# Patient Record
Sex: Female | Born: 1990 | Race: White | Hispanic: No | Smoking: Never smoker
Health system: Southern US, Community
[De-identification: ages and names within clinical notes are randomized; demographics above are authoritative.]

## PROBLEM LIST (undated history)

## (undated) HISTORY — PX: INDUCED ABORTION: SHX677

---

## 2015-03-29 NOTE — L&D Delivery Note (Signed)
Delivery Note At 10:17 PM a viable female was delivered via Vaginal, Spontaneous Delivery (Presentation: ;DOA  ).  APGAR: 9, 9; weight  .pending   Placenta status: ,intact, spontaneous .  Cord: 3 VC. Nuchal cord noted on perineum, tight and cord clamped and cut. .  Cord pH: not indicated  Anesthesia:  epidurl Episiotomy: None Lacerations: 2nd degree;Periurethral Suture Repair: 3.0 vicryl rapide Est. Blood Loss (mL):  350 cc  Mom to postpartum.  Baby to Couplet care / Skin to Skin.  Londen Lorge A. 10/27/2015, 10:38 PM

## 2015-04-17 LAB — OB RESULTS CONSOLE HEPATITIS B SURFACE ANTIGEN: Hepatitis B Surface Ag: NEGATIVE

## 2015-04-17 LAB — OB RESULTS CONSOLE HIV ANTIBODY (ROUTINE TESTING): HIV: NONREACTIVE

## 2015-04-17 LAB — OB RESULTS CONSOLE GC/CHLAMYDIA
Chlamydia: NEGATIVE
Gonorrhea: NEGATIVE

## 2015-04-17 LAB — OB RESULTS CONSOLE ABO/RH: RH Type: POSITIVE

## 2015-04-17 LAB — OB RESULTS CONSOLE RUBELLA ANTIBODY, IGM: Rubella: IMMUNE

## 2015-04-17 LAB — OB RESULTS CONSOLE ANTIBODY SCREEN: Antibody Screen: NEGATIVE

## 2015-04-17 LAB — OB RESULTS CONSOLE RPR: RPR: NONREACTIVE

## 2015-08-09 ENCOUNTER — Inpatient Hospital Stay (HOSPITAL_COMMUNITY): Admission: AD | Admit: 2015-08-09 | Payer: Self-pay | Source: Ambulatory Visit | Admitting: Obstetrics and Gynecology

## 2015-10-24 ENCOUNTER — Encounter (HOSPITAL_COMMUNITY): Payer: Self-pay | Admitting: *Deleted

## 2015-10-24 ENCOUNTER — Inpatient Hospital Stay (HOSPITAL_COMMUNITY)
Admission: AD | Admit: 2015-10-24 | Discharge: 2015-10-24 | Disposition: A | Discharge status: Home / Self Care | Source: Ambulatory Visit | Attending: Obstetrics and Gynecology | Admitting: Obstetrics and Gynecology

## 2015-10-24 DIAGNOSIS — O133 Gestational [pregnancy-induced] hypertension without significant proteinuria, third trimester: Secondary | ICD-10-CM | POA: Insufficient documentation

## 2015-10-24 DIAGNOSIS — N898 Other specified noninflammatory disorders of vagina: Secondary | ICD-10-CM

## 2015-10-24 DIAGNOSIS — O26893 Other specified pregnancy related conditions, third trimester: Secondary | ICD-10-CM | POA: Insufficient documentation

## 2015-10-24 DIAGNOSIS — Z3A37 37 weeks gestation of pregnancy: Secondary | ICD-10-CM

## 2015-10-24 DIAGNOSIS — O479 False labor, unspecified: Secondary | ICD-10-CM

## 2015-10-24 DIAGNOSIS — O471 False labor at or after 37 completed weeks of gestation: Secondary | ICD-10-CM | POA: Diagnosis not present

## 2015-10-24 LAB — COMPREHENSIVE METABOLIC PANEL
ALT: 11 U/L — ABNORMAL LOW (ref 14–54)
AST: 17 U/L (ref 15–41)
Albumin: 2.9 g/dL — ABNORMAL LOW (ref 3.5–5.0)
Alkaline Phosphatase: 106 U/L (ref 38–126)
Anion gap: 9 (ref 5–15)
BUN: 9 mg/dL (ref 6–20)
CO2: 24 mmol/L (ref 22–32)
Calcium: 8.7 mg/dL — ABNORMAL LOW (ref 8.9–10.3)
Chloride: 101 mmol/L (ref 101–111)
Creatinine, Ser: 0.77 mg/dL (ref 0.44–1.00)
GFR calc Af Amer: >60 mL/min (ref >60)
GFR calc non Af Amer: >60 mL/min (ref >60)
Glucose, Bld: 78 mg/dL (ref 65–99)
Potassium: 3.7 mmol/L (ref 3.5–5.1)
Sodium: 134 mmol/L — ABNORMAL LOW (ref 135–145)
Total Bilirubin: 0.9 mg/dL (ref 0.3–1.2)
Total Protein: 6.6 g/dL (ref 6.5–8.1)

## 2015-10-24 LAB — CBC
HCT: 39.4 % (ref 36.0–46.0)
Hemoglobin: 13.8 g/dL (ref 12.0–15.0)
MCH: 31.1 pg (ref 26.0–34.0)
MCHC: 35 g/dL (ref 30.0–36.0)
MCV: 88.7 fL (ref 78.0–100.0)
Platelets: 199 10*3/uL (ref 150–400)
RBC: 4.44 MIL/uL (ref 3.87–5.11)
RDW: 12.5 % (ref 11.5–15.5)
WBC: 10.2 10*3/uL (ref 4.0–10.5)

## 2015-10-24 LAB — URINALYSIS, ROUTINE W REFLEX MICROSCOPIC
Bilirubin Urine: NEGATIVE
Glucose, UA: NEGATIVE mg/dL
Hgb urine dipstick: NEGATIVE
Ketones, ur: NEGATIVE mg/dL
Leukocytes, UA: NEGATIVE
Nitrite: NEGATIVE
Protein, ur: NEGATIVE mg/dL
Specific Gravity, Urine: 1.015 (ref 1.005–1.030)
pH: 7 (ref 5.0–8.0)

## 2015-10-24 LAB — PROTEIN / CREATININE RATIO, URINE
Creatinine, Urine: 122 mg/dL
Protein Creatinine Ratio: 0.16 mg/mg{Cre} — ABNORMAL HIGH (ref 0.00–0.15)
Total Protein, Urine: 19 mg/dL

## 2015-10-24 LAB — POCT FERN TEST: POCT Fern Test: POSITIVE

## 2015-10-24 LAB — AMNISURE RUPTURE OF MEMBRANE (ROM) NOT AT ARMC: Amnisure ROM: NEGATIVE

## 2015-10-24 NOTE — MAU Note (Signed)
Pt states she is having contractions that last 25 to 30 seconds.  Pt states her lower abdomen just feels like it is cramping.  Pt states she is leaking every time she contracts.

## 2015-10-24 NOTE — Discharge Instructions (Signed)
Braxton Hicks Contractions °Contractions of the uterus can occur throughout pregnancy. Contractions are not always a sign that you are in labor.  °WHAT ARE BRAXTON HICKS CONTRACTIONS?  °Contractions that occur before labor are called Braxton Hicks contractions, or false labor. Toward the end of pregnancy (32-34 weeks), these contractions can develop more often and may become more forceful. This is not true labor because these contractions do not result in opening (dilatation) and thinning of the cervix. They are sometimes difficult to tell apart from true labor because these contractions can be forceful and people have different pain tolerances. You should not feel embarrassed if you go to the hospital with false labor. Sometimes, the only way to tell if you are in true labor is for your health care provider to look for changes in the cervix. °If there are no prenatal problems or other health problems associated with the pregnancy, it is completely safe to be sent home with false labor and await the onset of true labor. °HOW CAN YOU TELL THE DIFFERENCE BETWEEN TRUE AND FALSE LABOR? °False Labor °· The contractions of false labor are usually shorter and not as hard as those of true labor.   °· The contractions are usually irregular.   °· The contractions are often felt in the front of the lower abdomen and in the groin.   °· The contractions may go away when you walk around or change positions while lying down.   °· The contractions get weaker and are shorter lasting as time goes on.   °· The contractions do not usually become progressively stronger, regular, and closer together as with true labor.   °True Labor °· Contractions in true labor last 30-70 seconds, become very regular, usually become more intense, and increase in frequency.   °· The contractions do not go away with walking.   °· The discomfort is usually felt in the top of the uterus and spreads to the lower abdomen and low back.   °· True labor can be  determined by your health care provider with an exam. This will show that the cervix is dilating and getting thinner.   °WHAT TO REMEMBER °· Keep up with your usual exercises and follow other instructions given by your health care provider.   °· Take medicines as directed by your health care provider.   °· Keep your regular prenatal appointments.   °· Eat and drink lightly if you think you are going into labor.   °· If Braxton Hicks contractions are making you uncomfortable:   °¨ Change your position from lying down or resting to walking, or from walking to resting.   °¨ Sit and rest in a tub of warm water.   °¨ Drink 2-3 glasses of water. Dehydration may cause these contractions.   °¨ Do slow and deep breathing several times an hour.   °WHEN SHOULD I SEEK IMMEDIATE MEDICAL CARE? °Seek immediate medical care if: °· Your contractions become stronger, more regular, and closer together.   °· You have fluid leaking or gushing from your vagina.   °· You have a fever.   °· You pass blood-tinged mucus.   °· You have vaginal bleeding.   °· You have continuous abdominal pain.   °· You have low back pain that you never had before.   °· You feel your baby's head pushing down and causing pelvic pressure.   °· Your baby is not moving as much as it used to.   °  °This information is not intended to replace advice given to you by your health care provider. Make sure you discuss any questions you have with your health care   provider.   Document Released: 03/14/2005 Document Revised: 03/19/2013 Document Reviewed: 12/24/2012 Elsevier Interactive Patient Education 2016 [employer]. Hypertension During Pregnancy Hypertension, or high blood pressure, is when there is extra pressure inside your blood vessels that carry blood from the heart to the rest of your body (arteries). It can happen at any time in life, including pregnancy. Hypertension during pregnancy can cause problems for you and your baby. Your baby might not weigh as  much as he or she should at birth or might be born early (premature). Very bad cases of hypertension during pregnancy can be life-threatening.  Different types of hypertension can occur during pregnancy. These include:  Chronic hypertension. This happens when a woman has hypertension before pregnancy and it continues during pregnancy.  Gestational hypertension. This is when hypertension develops during pregnancy.  Preeclampsia or toxemia of pregnancy. This is a very serious type of hypertension that develops only during pregnancy. It affects the whole body and can be very dangerous for both mother and baby.  Gestational hypertension and preeclampsia usually go away after your baby is born. Your blood pressure will likely stabilize within 6 weeks. Women who have hypertension during pregnancy have a greater chance of developing hypertension later in life or with future pregnancies. RISK FACTORS There are certain factors that make it more likely for you to develop hypertension during pregnancy. These include:  Having hypertension before pregnancy.  Having hypertension during a previous pregnancy.  Being overweight.  Being older than 40 years.  Being pregnant with more than one baby.  Having diabetes or kidney problems. SIGNS AND SYMPTOMS Chronic and gestational hypertension rarely cause symptoms. Preeclampsia has symptoms, which may include:  Increased protein in your urine. Your health care provider will check for this at every prenatal visit.  Swelling of your hands and face.  Rapid weight gain.  Headaches.  Visual changes.  Being bothered by light.  Abdominal pain, especially in the upper right area.  Chest pain.  Shortness of breath.  Increased reflexes.  Seizures. These occur with a more severe form of preeclampsia, called eclampsia. DIAGNOSIS  You may be diagnosed with hypertension during a regular prenatal exam. At each prenatal visit, you may have:  Your blood  pressure checked.  A urine test to check for protein in your urine. The type of hypertension you are diagnosed with depends on when you developed it. It also depends on your specific blood pressure reading.  Developing hypertension before 20 weeks of pregnancy is consistent with chronic hypertension.  Developing hypertension after 20 weeks of pregnancy is consistent with gestational hypertension.  Hypertension with increased urinary protein is diagnosed as preeclampsia.  Blood pressure measurements that stay above 0000000 systolic or [id number] diastolic are a sign of severe preeclampsia. TREATMENT Treatment for hypertension during pregnancy varies. Treatment depends on the type of hypertension and how serious it is.  If you take medicine for chronic hypertension, you may need to switch medicines.  Medicines called ACE inhibitors should not be taken during pregnancy.  Low-dose aspirin may be suggested for women who have risk factors for preeclampsia.  If you have gestational hypertension, you may need to take a blood pressure medicine that is safe during pregnancy. Your health care provider will recommend the correct medicine.  If you have severe preeclampsia, you may need to be in the hospital. Health care providers will watch you and your baby very closely. You also may need to take medicine called magnesium sulfate to prevent seizures and lower blood  pressure.  Sometimes, an early delivery is needed. This may be the case if the condition worsens. It would be done to protect you and your baby. The only cure for preeclampsia is delivery.  Your health care provider may recommend that you take one low-dose aspirin (81 mg) each day to help prevent high blood pressure during your pregnancy if you are at risk for preeclampsia. You may be at risk for preeclampsia if:  You had preeclampsia or eclampsia during a previous pregnancy.  Your baby did not grow as expected during a previous pregnancy.  You  experienced preterm birth with a previous pregnancy.  You experienced a separation of the placenta from the uterus (placental abruption) during a previous pregnancy.  You experienced the loss of your baby during a previous pregnancy.  You are pregnant with more than one baby.  You have other medical conditions, such as diabetes or an autoimmune disease. HOME CARE INSTRUCTIONS  Schedule and keep all of your regular prenatal care appointments. This is important.  Take medicines only as directed by your health care provider. Tell your health care provider about all medicines you take.  Eat as little salt as possible.  Get regular exercise.  Do not drink alcohol.  Do not use tobacco products.  Do not drink products with caffeine.  Lie on your left side when resting. SEEK IMMEDIATE MEDICAL CARE IF:  You have severe abdominal pain.  You have sudden swelling in your hands, ankles, or face.  You gain 4 pounds (1.8 kg) or more in 1 week.  You vomit repeatedly.  You have vaginal bleeding.  You do not feel your baby moving as much.  You have a headache.  You have blurred or double vision.  You have muscle twitching or spasms.  You have shortness of breath.  You have blue fingernails or lips.  You have blood in your urine. MAKE SURE YOU:  Understand these instructions.  Will watch your condition.  Will get help right away if you are not doing well or get worse.   This information is not intended to replace advice given to you by your health care provider. Make sure you discuss any questions you have with your health care provider.   Document Released: 11/30/2010 Document Revised: 04/04/2014 Document Reviewed: 10/11/2012 Elsevier Interactive Patient Education [employer].

## 2015-10-24 NOTE — MAU Provider Note (Signed)
S: Claudia Rodriguez is a 25 y.o. year old G12P0020 female at [redacted]w[redacted]d weeks gestation who presents to MAU reporting contractions, leaking increased thin white fluid since this morning. Patient was noted to have elevated blood pressure during MAU visit today. No history of hypertension before during pregnancy. Denies headache, vision changes or epigastric pain.  O: Patient Vitals for the past 24 hrs:  BP Temp Temp src Pulse Resp  10/24/15 1243 125/87 - - 76 -  10/24/15 1100 126/90 - - 89 -  10/24/15 1045 150/97 - - 84 -  10/24/15 1030 138/93 - - 92 -  10/24/15 1015 136/100 - - 91 -  10/24/15 1000 131/93 97.5 F (36.4 C) Oral 95 20  10/24/15 0955 137/97 - - 88 -  10/24/15 0953 137/97 - - 84 -   General: Mild discomfort with contractions Abdomen soft, nontender, gravid, size equals dates. Ext: 1+ pedal edema Neuro: DTRs 2+, no clonus Pelvic: Normal external female genitalia. Moderate amount of thick, white, odorless discharge mixed with small amount of thin, white discharge. Neg pooling.   One area of questionable ferning (cervical mucus?) Amnisure ordered per Dr. Ronita Hipps neg  Results for orders placed or performed during the hospital encounter of 10/24/15 (from the past 24 hour(s))  Urinalysis, Routine w reflex microscopic (not at [facility])     Status: None   Collection Time: 10/24/15  9:43 AM  Result Value Ref Range   Color, Urine YELLOW YELLOW   APPearance CLEAR CLEAR   Specific Gravity, Urine 1.015 1.005 - 1.030   pH 7.0 5.0 - 8.0   Glucose, UA NEGATIVE NEGATIVE mg/dL   Hgb urine dipstick NEGATIVE NEGATIVE   Bilirubin Urine NEGATIVE NEGATIVE   Ketones, ur NEGATIVE NEGATIVE mg/dL   Protein, ur NEGATIVE NEGATIVE mg/dL   Nitrite NEGATIVE NEGATIVE   Leukocytes, UA NEGATIVE NEGATIVE  Protein / creatinine ratio, urine     Status: Abnormal   Collection Time: 10/24/15  9:43 AM  Result Value Ref Range   Creatinine, Urine 122.00 mg/dL   Total Protein, Urine 19 mg/dL   Protein Creatinine  Ratio 0.16 (H) 0.00 - 0.15 mg/mg[Cre]  CBC     Status: None   Collection Time: 10/24/15 10:29 AM  Result Value Ref Range   WBC 10.2 4.0 - 10.5 K/uL   RBC 4.44 3.87 - 5.11 MIL/uL   Hemoglobin 13.8 12.0 - 15.0 g/dL   HCT 39.4 36.0 - 46.0 %   MCV 88.7 78.0 - 100.0 fL   MCH 31.1 26.0 - 34.0 pg   MCHC 35.0 30.0 - 36.0 g/dL   RDW 12.5 11.5 - 15.5 %   Platelets 199 150 - 400 K/uL  Comprehensive metabolic panel     Status: Abnormal   Collection Time: 10/24/15 10:29 AM  Result Value Ref Range   Sodium 134 (L) 135 - 145 mmol/L   Potassium 3.7 3.5 - 5.1 mmol/L   Chloride 101 101 - 111 mmol/L   CO2 24 22 - 32 mmol/L   Glucose, Bld 78 65 - 99 mg/dL   BUN 9 6 - 20 mg/dL   Creatinine, Ser 0.77 0.44 - 1.00 mg/dL   Calcium 8.7 (L) 8.9 - 10.3 mg/dL   Total Protein 6.6 6.5 - 8.1 g/dL   Albumin 2.9 (L) 3.5 - 5.0 g/dL   AST 17 15 - 41 U/L   ALT 11 (L) 14 - 54 U/L   Alkaline Phosphatase 106 38 - 126 U/L   Total Bilirubin 0.9 0.3 - 1.2  mg/dL   GFR calc non Af Amer >60 >60 mL/min   GFR calc Af Amer >60 >60 mL/min   Anion gap 9 5 - 15  POCT fern test     Status: Abnormal   Collection Time: 10/24/15 11:49 AM  Result Value Ref Range   POCT Fern Test Positive = ruptured amniotic membanes   Amnisure rupture of membrane (rom)not at [facility]     Status: None   Collection Time: 10/24/15 11:52 AM  Result Value Ref Range   Amnisure ROM NEGATIVE     A:  Gestational HTN w/ out evidence of Pre-E. Vaginal discharge pregnancy--Probable VVC. Doubt ROM 2/2 exam, neg amnisure.   P: Discharge home in stable condition per consult with Dr. Ronita Hipps. Follow-up at [facility] as scheduled 10/28/2015 Return to maternity admissions as needed for labor, leaking of fluid or preeclampsia symptoms. Fetal kick counts, preeclampsia precautions, labor precautions. Declines Tx for VVC.   [place], [place] 10/24/2015 3:53 PM

## 2015-10-25 ENCOUNTER — Inpatient Hospital Stay (EMERGENCY_DEPARTMENT_HOSPITAL)
Admission: AD | Admit: 2015-10-25 | Discharge: 2015-10-25 | Disposition: A | Discharge status: Home / Self Care | Source: Ambulatory Visit | Attending: Obstetrics and Gynecology | Admitting: Obstetrics and Gynecology

## 2015-10-25 ENCOUNTER — Encounter (HOSPITAL_COMMUNITY): Payer: Self-pay | Admitting: *Deleted

## 2015-10-25 DIAGNOSIS — Z3A37 37 weeks gestation of pregnancy: Secondary | ICD-10-CM

## 2015-10-25 DIAGNOSIS — Z880 Allergy status to penicillin: Secondary | ICD-10-CM

## 2015-10-25 DIAGNOSIS — O471 False labor at or after 37 completed weeks of gestation: Secondary | ICD-10-CM | POA: Diagnosis not present

## 2015-10-25 DIAGNOSIS — Z79899 Other long term (current) drug therapy: Secondary | ICD-10-CM | POA: Insufficient documentation

## 2015-10-25 LAB — COMPREHENSIVE METABOLIC PANEL
ALT: 12 U/L — ABNORMAL LOW (ref 14–54)
AST: 18 U/L (ref 15–41)
Albumin: 2.8 g/dL — ABNORMAL LOW (ref 3.5–5.0)
Alkaline Phosphatase: 101 U/L (ref 38–126)
Anion gap: 7 (ref 5–15)
BUN: 10 mg/dL (ref 6–20)
CO2: 22 mmol/L (ref 22–32)
Calcium: 8.9 mg/dL (ref 8.9–10.3)
Chloride: 105 mmol/L (ref 101–111)
Creatinine, Ser: 0.6 mg/dL (ref 0.44–1.00)
GFR calc Af Amer: >60 mL/min (ref >60)
GFR calc non Af Amer: >60 mL/min (ref >60)
Glucose, Bld: 78 mg/dL (ref 65–99)
Potassium: 3.8 mmol/L (ref 3.5–5.1)
Sodium: 134 mmol/L — ABNORMAL LOW (ref 135–145)
Total Bilirubin: 1.1 mg/dL (ref 0.3–1.2)
Total Protein: 6.4 g/dL — ABNORMAL LOW (ref 6.5–8.1)

## 2015-10-25 LAB — URINALYSIS, ROUTINE W REFLEX MICROSCOPIC
Bilirubin Urine: NEGATIVE
Glucose, UA: NEGATIVE mg/dL
Ketones, ur: NEGATIVE mg/dL
Nitrite: NEGATIVE
Protein, ur: NEGATIVE mg/dL
Specific Gravity, Urine: 1.015 (ref 1.005–1.030)
pH: 7 (ref 5.0–8.0)

## 2015-10-25 LAB — PROTEIN / CREATININE RATIO, URINE
Creatinine, Urine: 99 mg/dL
Protein Creatinine Ratio: 0.18 mg/mg{Cre} — ABNORMAL HIGH (ref 0.00–0.15)
Total Protein, Urine: 18 mg/dL

## 2015-10-25 LAB — CBC
HCT: 38.1 % (ref 36.0–46.0)
Hemoglobin: 13.5 g/dL (ref 12.0–15.0)
MCH: 31.2 pg (ref 26.0–34.0)
MCHC: 35.4 g/dL (ref 30.0–36.0)
MCV: 88 fL (ref 78.0–100.0)
Platelets: 197 10*3/uL (ref 150–400)
RBC: 4.33 MIL/uL (ref 3.87–5.11)
RDW: 12.4 % (ref 11.5–15.5)
WBC: 9.9 10*3/uL (ref 4.0–10.5)

## 2015-10-25 LAB — URINE MICROSCOPIC-ADD ON

## 2015-10-25 LAB — POCT FERN TEST: POCT Fern Test: NEGATIVE

## 2015-10-25 MED ORDER — ZOLPIDEM TARTRATE 5 MG PO TABS: 5.0000 mg | ORAL_TABLET | Freq: Every evening | ORAL | 1 refills | Status: DC | PRN

## 2015-10-25 MED ORDER — NALBUPHINE HCL 10 MG/ML IJ SOLN
5.0000 mg | Freq: Once | INTRAMUSCULAR | Status: AC
  Administered 2015-10-25: 5 mg via INTRAVENOUS
  Filled 2015-10-25: qty 1

## 2015-10-25 NOTE — MAU Provider Note (Signed)
I was asked to recheck the patient's cervix since I had checked it before Patient was not discharged after the last note. She is very reluctant to go "because I can't do this at home".  Cervix has only changed slightly in 3 hours  Vitals:   10/25/15 1359 10/25/15 1414 10/25/15 1429 10/25/15 1445  BP: 128/92 125/90 120/82 125/93  Pulse: 86 82 85 84  Resp:      Temp:      TempSrc:       Results for orders placed or performed during the hospital encounter of 10/25/15 (from the past 72 hour(s))  Urinalysis, Routine w reflex microscopic (not at [facility])     Status: Abnormal   Collection Time: 10/25/15 11:53 AM  Result Value Ref Range   Color, Urine YELLOW YELLOW   APPearance CLEAR CLEAR   Specific Gravity, Urine 1.015 1.005 - 1.030   pH 7.0 5.0 - 8.0   Glucose, UA NEGATIVE NEGATIVE mg/dL   Hgb urine dipstick SMALL (A) NEGATIVE   Bilirubin Urine NEGATIVE NEGATIVE   Ketones, ur NEGATIVE NEGATIVE mg/dL   Protein, ur NEGATIVE NEGATIVE mg/dL   Nitrite NEGATIVE NEGATIVE   Leukocytes, UA SMALL (A) NEGATIVE  Protein / creatinine ratio, urine     Status: Abnormal   Collection Time: 10/25/15 11:53 AM  Result Value Ref Range   Creatinine, Urine 99.00 mg/dL   Total Protein, Urine 18 mg/dL    Comment: NO NORMAL RANGE ESTABLISHED FOR THIS TEST   Protein Creatinine Ratio 0.18 (H) 0.00 - 0.15 mg/mg[Cre]  Urine microscopic-add on     Status: Abnormal   Collection Time: 10/25/15 11:53 AM  Result Value Ref Range   Squamous Epithelial / LPF TOO NUMEROUS TO COUNT (A) NONE SEEN   WBC, UA 6-30 0 - 5 WBC/hpf   RBC / HPF 0-5 0 - 5 RBC/hpf   Bacteria, UA MANY (A) NONE SEEN   Urine-Other MUCOUS PRESENT   Fern Test     Status: Normal   Collection Time: 10/25/15 12:20 PM  Result Value Ref Range   POCT Fern Test Negative = intact amniotic membranes   CBC     Status: None   Collection Time: 10/25/15  1:00 PM  Result Value Ref Range   WBC 9.9 4.0 - 10.5 K/uL   RBC 4.33 3.87 - 5.11 MIL/uL   Hemoglobin  13.5 12.0 - 15.0 g/dL   HCT 38.1 36.0 - 46.0 %   MCV 88.0 78.0 - 100.0 fL   MCH 31.2 26.0 - 34.0 pg   MCHC 35.4 30.0 - 36.0 g/dL   RDW 12.4 11.5 - 15.5 %   Platelets 197 150 - 400 K/uL  Comprehensive metabolic panel     Status: Abnormal   Collection Time: 10/25/15  1:00 PM  Result Value Ref Range   Sodium 134 (L) 135 - 145 mmol/L   Potassium 3.8 3.5 - 5.1 mmol/L   Chloride 105 101 - 111 mmol/L   CO2 22 22 - 32 mmol/L   Glucose, Bld 78 65 - 99 mg/dL   BUN 10 6 - 20 mg/dL   Creatinine, Ser 0.60 0.44 - 1.00 mg/dL   Calcium 8.9 8.9 - 10.3 mg/dL   Total Protein 6.4 (L) 6.5 - 8.1 g/dL   Albumin 2.8 (L) 3.5 - 5.0 g/dL   AST 18 15 - 41 U/L   ALT 12 (L) 14 - 54 U/L   Alkaline Phosphatase 101 38 - 126 U/L   Total Bilirubin 1.1 0.3 -  1.2 mg/dL   GFR calc non Af Amer >60 >60 mL/min   GFR calc Af Amer >60 >60 mL/min    Comment: (NOTE) The eGFR has been calculated using the CKD EPI equation. This calculation has not been validated in all clinical situations. eGFR's persistently <60 mL/min signify possible Chronic Kidney Disease.    Anion gap 7 5 - 15   Pt denies headache or visual changes.  A:  SIUP at [redacted]w[redacted]d      Gestational hypertension       Latent phase vs prodromal labor  P:  [facility]

## 2015-10-25 NOTE — Progress Notes (Signed)
Dr. Benjie Karvonen notified of pt in MAU.  Notified of a G3P0 at [redacted]w[redacted]d.  Notified that pt was checked earlier and was 2.5 cm and at her last check was 3, 80, -2, and vertex.  Notified that pt had elevated blood pressures and lab yesterday.  Notified that pt had elevated blood pressures again today.  Notified of protein creatine ratio results.  Notified of pt blood pressure readings.  Provider states to discharge home with labor precautions and educate pt about hypertension in pregnancy.  Provider states to have the pt call the office in the morning to make a follow-up appointment.

## 2015-10-25 NOTE — MAU Note (Signed)
Pt states she had a gush of fluid around 0900 this morning and feels like her belly is smaller.  After that the contractions got closer together and stronger.  Pt states she did not stop contracting after she left here yesterday.

## 2015-10-25 NOTE — MAU Provider Note (Signed)
Chief Complaint:  Contractions   First Provider Initiated Contact with Patient 10/25/15 1208     HPI  HPI: Claudia Rodriguez is a 25 y.o. G3P0020 at 71w6dwho presents to maternity admissions reporting contractions and possible leaking of fluids.  Was called by RN to rule out labor. Mild diastolic BP elevation.  She reports good fetal movement, denies vaginal bleeding, vaginal itching/burning, urinary symptoms, h/a, dizziness, n/v, diarrhea, constipation or fever/chills.  She denies headache, visual changes or RUQ abdominal pain. RN Note: Pt states she had a gush of fluid around 0900 this morning and feels like her belly is smaller.  After that the contractions got closer together and stronger.  Pt states she did not stop contracting after she left here yesterday.  Past Medical History: History reviewed. No pertinent past medical history.  Past obstetric history: OB History  Gravida Para Term Preterm AB Living  3       2    SAB TAB Ectopic Multiple Live Births               # Outcome Date GA Lbr Len/2nd Weight Sex Delivery Anes PTL Lv  3 Current           2 AB           1 AB               Past Surgical History: Past Surgical History:  Procedure Laterality Date  . INDUCED ABORTION     twice    Family History: History reviewed. No pertinent family history.  Social History: Social History  Substance Use Topics  . Smoking status: Never Smoker  . Smokeless tobacco: Never Used  . Alcohol use No    Allergies:  Allergies  Allergen Reactions  . Amoxicillin Hives and Other (See Comments)    Has patient had a PCN reaction causing immediate rash, facial/tongue/throat swelling, SOB or lightheadedness with hypotension: No Has patient had a PCN reaction causing severe rash involving mucus membranes or skin necrosis: No Has patient had a PCN reaction that required hospitalization No Has patient had a PCN reaction occurring within the last 10 years: No If all of the above answers are  "NO", then may proceed with Cephalosporin use.  Marland Kitchen Penicillins Hives and Other (See Comments)    Has patient had a PCN reaction causing immediate rash, facial/tongue/throat swelling, SOB or lightheadedness with hypotension: No Has patient had a PCN reaction causing severe rash involving mucus membranes or skin necrosis: No Has patient had a PCN reaction that required hospitalization No Has patient had a PCN reaction occurring within the last 10 years: No If all of the above answers are "NO", then may proceed with Cephalosporin use.    Meds:  Prescriptions Prior to Admission  Medication Sig Dispense Refill Last Dose  . Prenatal Vit-Fe Fumarate-FA (PRENATAL MULTIVITAMIN) TABS tablet Take 1 tablet by mouth at bedtime.   10/24/2015 at Unknown time  . sertraline (ZOLOFT) 50 MG tablet Take 50 mg by mouth at bedtime.   10/24/2015 at Unknown time    I have reviewed patient's Past Medical Hx, Surgical Hx, Family Hx, Social Hx, medications and allergies.   ROS:  Review of Systems  Constitutional: Positive for fatigue. Negative for chills and fever.  Gastrointestinal: Positive for abdominal pain. Negative for diarrhea, nausea and vomiting.  Genitourinary: Positive for pelvic pain and vaginal discharge. Negative for vaginal bleeding.   Other systems negative  Physical Exam  Patient Vitals for the past 24 hrs:  BP Temp Temp src Pulse Resp  10/25/15 1214 139/97 - - 82 -  10/25/15 1159 (!) 133/105 - - 91 -  10/25/15 1154 132/95 98.4 F (36.9 C) Oral 98 20   Constitutional: Well-developed, well-nourished female in no acute distress, but breathing through contractions.  Cardiovascular: normal rate and rhythm Respiratory: normal effort, clear to auscultation bilaterally GI: Abd soft, non-tender, gravid appropriate for gestational age.   No rebound or guarding. MS: Extremities nontender, no edema, normal ROM Neurologic: Alert and oriented x 4.  GU: Neg CVAT.  PELVIC EXAM: Cervix pink, visually  closed, without lesion, scant white creamy discharge, vaginal walls and external genitalia normal    NO POOLING, NO FERNING  Dilation: 2.5 Effacement (%): 80 Station: -2 Presentation: Vertex Exam by:: Hansel Feinstein, CNM  FHT:  Baseline 140 , moderate variability, accelerations present, no decelerations Contractions: q 4-5 mins   Labs: No results found for this or any previous visit (from the past 24 hour(s)).    Imaging:  No results found.  MAU Course/MDM: No ferning seen  Assessment: SIUP at [redacted]w[redacted]d  Uterine contractions- mild- no cervical change No evidence of rupture of membranes  Plan: CBC , CMP and Urine P/C ratio ordered  - normal Serial BP monitoring Reck VE in 2 hours and assess for labor States her pain is "9" so will give IM Nubain for prodromal labor No s/s PEC.    Medication List    ASK your doctor about these medications   prenatal multivitamin Tabs tablet Take 1 tablet by mouth at bedtime.   sertraline 50 MG tablet Commonly known as:  ZOLOFT Take 50 mg by mouth at bedtime.      Pt stable at time of discharge.  Hansel Feinstein CNM, MSN Certified Nurse-Midwife 10/25/2015 12:18 PM

## 2015-10-25 NOTE — Discharge Instructions (Signed)
Third Trimester of Pregnancy °The third trimester is from week 29 through week 42, months 7 through 9. The third trimester is a time when the fetus is growing rapidly. At the end of the ninth month, the fetus is about 20 inches in length and weighs 6-10 pounds.  °BODY CHANGES °Your body goes through many changes during pregnancy. The changes vary from woman to woman.  °· Your weight will continue to increase. You can expect to gain 25-35 pounds (11-16 kg) by the end of the pregnancy. °· You may begin to get stretch marks on your hips, abdomen, and breasts. °· You may urinate more often because the fetus is moving lower into your pelvis and pressing on your bladder. °· You may develop or continue to have heartburn as a result of your pregnancy. °· You may develop constipation because certain hormones are causing the muscles that push waste through your intestines to slow down. °· You may develop hemorrhoids or swollen, bulging veins (varicose veins). °· You may have pelvic pain because of the weight gain and pregnancy hormones relaxing your joints between the bones in your pelvis. Backaches may result from overexertion of the muscles supporting your posture. °· You may have changes in your hair. These can include thickening of your hair, rapid growth, and changes in texture. Some women also have hair loss during or after pregnancy, or hair that feels dry or thin. Your hair will most likely return to normal after your baby is born. °· Your breasts will continue to grow and be tender. A yellow discharge may leak from your breasts called colostrum. °· Your belly button may stick out. °· You may feel short of breath because of your expanding uterus. °· You may notice the fetus "dropping," or moving lower in your abdomen. °· You may have a bloody mucus discharge. This usually occurs a few days to a week before labor begins. °· Your cervix becomes thin and soft (effaced) near your due date. °WHAT TO EXPECT AT YOUR PRENATAL  EXAMS  °You will have prenatal exams every 2 weeks until week 36. Then, you will have weekly prenatal exams. During a routine prenatal visit: °· You will be weighed to make sure you and the fetus are growing normally. °· Your blood pressure is taken. °· Your abdomen will be measured to track your baby's growth. °· The fetal heartbeat will be listened to. °· Any test results from the previous visit will be discussed. °· You may have a cervical check near your due date to see if you have effaced. °At around 36 weeks, your caregiver will check your cervix. At the same time, your caregiver will also perform a test on the secretions of the vaginal tissue. This test is to determine if a type of bacteria, Group B streptococcus, is present. Your caregiver will explain this further. °Your caregiver may ask you: °· What your birth plan is. °· How you are feeling. °· If you are feeling the baby move. °· If you have had any abnormal symptoms, such as leaking fluid, bleeding, severe headaches, or abdominal cramping. °· If you are using any tobacco products, including cigarettes, chewing tobacco, and electronic cigarettes. °· If you have any questions. °Other tests or screenings that may be performed during your third trimester include: °· Blood tests that check for low iron levels (anemia). °· Fetal testing to check the health, activity level, and growth of the fetus. Testing is done if you have certain medical conditions or if   there are problems during the pregnancy. °· HIV (human immunodeficiency virus) testing. If you are at high risk, you may be screened for HIV during your third trimester of pregnancy. °FALSE LABOR °You may feel small, irregular contractions that eventually go away. These are called Braxton Hicks contractions, or false labor. Contractions may last for hours, days, or even weeks before true labor sets in. If contractions come at regular intervals, intensify, or become painful, it is best to be seen by your  caregiver.  °SIGNS OF LABOR  °· Menstrual-like cramps. °· Contractions that are 5 minutes apart or less. °· Contractions that start on the top of the uterus and spread down to the lower abdomen and back. °· A sense of increased pelvic pressure or back pain. °· A watery or bloody mucus discharge that comes from the vagina. °If you have any of these signs before the 37th week of pregnancy, call your caregiver right away. You need to go to the hospital to get checked immediately. °HOME CARE INSTRUCTIONS  °· Avoid all smoking, herbs, alcohol, and unprescribed drugs. These chemicals affect the formation and growth of the baby. °· Do not use any tobacco products, including cigarettes, chewing tobacco, and electronic cigarettes. If you need help quitting, ask your health care provider. You may receive counseling support and other resources to help you quit. °· Follow your caregiver's instructions regarding medicine use. There are medicines that are either safe or unsafe to take during pregnancy. °· Exercise only as directed by your caregiver. Experiencing uterine cramps is a good sign to stop exercising. °· Continue to eat regular, healthy meals. °· Wear a good support bra for breast tenderness. °· Do not use hot tubs, steam rooms, or saunas. °· Wear your seat belt at all times when driving. °· Avoid raw meat, uncooked cheese, cat litter boxes, and soil used by cats. These carry germs that can cause birth defects in the baby. °· Take your prenatal vitamins. °· Take 1500-2000 mg of calcium daily starting at the 20th week of pregnancy until you deliver your baby. °· Try taking a stool softener (if your caregiver approves) if you develop constipation. Eat more high-fiber foods, such as fresh vegetables or fruit and whole grains. Drink plenty of fluids to keep your urine clear or pale yellow. °· Take warm sitz baths to soothe any pain or discomfort caused by hemorrhoids. Use hemorrhoid cream if your caregiver approves. °· If  you develop varicose veins, wear support hose. Elevate your feet for 15 minutes, 3-4 times a day. Limit salt in your diet. °· Avoid heavy lifting, wear low heal shoes, and practice good posture. °· Rest a lot with your legs elevated if you have leg cramps or low back pain. °· Visit your dentist if you have not gone during your pregnancy. Use a soft toothbrush to brush your teeth and be gentle when you floss. °· A sexual relationship may be continued unless your caregiver directs you otherwise. °· Do not travel far distances unless it is absolutely necessary and only with the approval of your caregiver. °· Take prenatal classes to understand, practice, and ask questions about the labor and delivery. °· Make a trial run to the hospital. °· Pack your hospital bag. °· Prepare the baby's nursery. °· Continue to go to all your prenatal visits as directed by your caregiver. °SEEK MEDICAL CARE IF: °· You are unsure if you are in labor or if your water has broken. °· You have dizziness. °· You have   mild pelvic cramps, pelvic pressure, or nagging pain in your abdominal area. °· You have persistent nausea, vomiting, or diarrhea. °· You have a bad smelling vaginal discharge. °· You have pain with urination. °SEEK IMMEDIATE MEDICAL CARE IF:  °· You have a fever. °· You are leaking fluid from your vagina. °· You have spotting or bleeding from your vagina. °· You have severe abdominal cramping or pain. °· You have rapid weight loss or gain. °· You have shortness of breath with chest pain. °· You notice sudden or extreme swelling of your face, hands, ankles, feet, or legs. °· You have not felt your baby move in over an hour. °· You have severe headaches that do not go away with medicine. °· You have vision changes. °  °This information is not intended to replace advice given to you by your health care provider. Make sure you discuss any questions you have with your health care provider. °  °Document Released: 03/08/2001 Document  Revised: 04/04/2014 Document Reviewed: 05/15/2012 °Elsevier Interactive Patient Education ©2016 [employer] °Fetal Movement Counts °Patient Name: __________________________________________________ Patient Due Date: ____________________ °Performing a fetal movement count is highly recommended in high-risk pregnancies, but it is good for every pregnant woman to do. Your health care provider may ask you to start counting fetal movements at 28 weeks of the pregnancy. Fetal movements often increase: °· After eating a full meal. °· After physical activity. °· After eating or drinking something sweet or cold. °· At rest. °Pay attention to when you feel the baby is most active. This will help you notice a pattern of your baby's sleep and wake cycles and what factors contribute to an increase in fetal movement. It is important to perform a fetal movement count at the same time each day when your baby is normally most active.  °HOW TO COUNT FETAL MOVEMENTS °1. Find a quiet and comfortable area to sit or lie down on your left side. Lying on your left side provides the best blood and oxygen circulation to your baby. °2. Write down the day and time on a sheet of paper or in a journal. °3. Start counting kicks, flutters, swishes, rolls, or jabs in a 2-hour period. You should feel at least 10 movements within 2 hours. °4. If you do not feel 10 movements in 2 hours, wait 2-3 hours and count again. Look for a change in the pattern or not enough counts in 2 hours. °SEEK MEDICAL CARE IF: °· You feel less than 10 counts in 2 hours, tried twice. °· There is no movement in over an hour. °· The pattern is changing or taking longer each day to reach 10 counts in 2 hours. °· You feel the baby is not moving as he or she usually does. °Date: ____________ Movements: ____________ Start time: ____________ Finish time: ____________  °Date: ____________ Movements: ____________ Start time: ____________ Finish time: ____________ °Date:  ____________ Movements: ____________ Start time: ____________ Finish time: ____________ °Date: ____________ Movements: ____________ Start time: ____________ Finish time: ____________ °Date: ____________ Movements: ____________ Start time: ____________ Finish time: ____________ °Date: ____________ Movements: ____________ Start time: ____________ Finish time: ____________ °Date: ____________ Movements: ____________ Start time: ____________ Finish time: ____________ °Date: ____________ Movements: ____________ Start time: ____________ Finish time: ____________  °Date: ____________ Movements: ____________ Start time: ____________ Finish time: ____________ °Date: ____________ Movements: ____________ Start time: ____________ Finish time: ____________ °Date: ____________ Movements: ____________ Start time: ____________ Finish time: ____________ °Date: ____________ Movements: ____________ Start time: ____________ Finish time: ____________ °Date:   ____________ Movements: ____________ Start time: ____________ Elizebeth Koller time: ____________ Date: ____________ Movements: ____________ Start time: ____________ Elizebeth Koller time: ____________ Date: ____________ Movements: ____________ Start time: ____________ Elizebeth Koller time: ____________  Date: ____________ Movements: ____________ Start time: ____________ Elizebeth Koller time: ____________ Date: ____________ Movements: ____________ Start time: ____________ Elizebeth Koller time: ____________ Date: ____________ Movements: ____________ Start time: ____________ Elizebeth Koller time: ____________ Date: ____________ Movements: ____________ Start time: ____________ Elizebeth Koller time: ____________ Date: ____________ Movements: ____________ Start time: ____________ Elizebeth Koller time: ____________ Date: ____________ Movements: ____________ Start time: ____________ Elizebeth Koller time: ____________ Date: ____________ Movements: ____________ Start time: ____________ Elizebeth Koller time: ____________  Date: ____________ Movements: ____________ Start  time: ____________ Elizebeth Koller time: ____________ Date: ____________ Movements: ____________ Start time: ____________ Elizebeth Koller time: ____________ Date: ____________ Movements: ____________ Start time: ____________ Elizebeth Koller time: ____________ Date: ____________ Movements: ____________ Start time: ____________ Elizebeth Koller time: ____________ Date: ____________ Movements: ____________ Start time: ____________ Elizebeth Koller time: ____________ Date: ____________ Movements: ____________ Start time: ____________ Elizebeth Koller time: ____________ Date: ____________ Movements: ____________ Start time: ____________ Elizebeth Koller time: ____________  Date: ____________ Movements: ____________ Start time: ____________ Elizebeth Koller time: ____________ Date: ____________ Movements: ____________ Start time: ____________ Elizebeth Koller time: ____________ Date: ____________ Movements: ____________ Start time: ____________ Elizebeth Koller time: ____________ Date: ____________ Movements: ____________ Start time: ____________ Elizebeth Koller time: ____________ Date: ____________ Movements: ____________ Start time: ____________ Elizebeth Koller time: ____________ Date: ____________ Movements: ____________ Start time: ____________ Elizebeth Koller time: ____________ Date: ____________ Movements: ____________ Start time: ____________ Elizebeth Koller time: ____________  Date: ____________ Movements: ____________ Start time: ____________ Elizebeth Koller time: ____________ Date: ____________ Movements: ____________ Start time: ____________ Elizebeth Koller time: ____________ Date: ____________ Movements: ____________ Start time: ____________ Elizebeth Koller time: ____________ Date: ____________ Movements: ____________ Start time: ____________ Elizebeth Koller time: ____________ Date: ____________ Movements: ____________ Start time: ____________ Elizebeth Koller time: ____________ Date: ____________ Movements: ____________ Start time: ____________ Elizebeth Koller time: ____________ Date: ____________ Movements: ____________ Start time: ____________ Elizebeth Koller time: ____________    Date: ____________ Movements: ____________ Start time: ____________ Elizebeth Koller time: ____________ Date: ____________ Movements: ____________ Start time: ____________ Elizebeth Koller time: ____________ Date: ____________ Movements: ____________ Start time: ____________ Elizebeth Koller time: ____________ Date: ____________ Movements: ____________ Start time: ____________ Elizebeth Koller time: ____________ Date: ____________ Movements: ____________ Start time: ____________ Elizebeth Koller time: ____________ Date: ____________ Movements: ____________ Start time: ____________ Elizebeth Koller time: ____________ Date: ____________ Movements: ____________ Start time: ____________ Elizebeth Koller time: ____________  Date: ____________ Movements: ____________ Start time: ____________ Elizebeth Koller time: ____________ Date: ____________ Movements: ____________ Start time: ____________ Elizebeth Koller time: ____________ Date: ____________ Movements: ____________ Start time: ____________ Elizebeth Koller time: ____________ Date: ____________ Movements: ____________ Start time: ____________ Elizebeth Koller time: ____________ Date: ____________ Movements: ____________ Start time: ____________ Elizebeth Koller time: ____________ Date: ____________ Movements: ____________ Start time: ____________ Elizebeth Koller time: ____________   This information is not intended to replace advice given to you by your health care provider. Make sure you discuss any questions you have with your health care provider.   Document Released: 04/13/2006 Document Revised: 04/04/2014 Document Reviewed: 01/09/2012 Elsevier Interactive Patient Education 2016 [employer]. Hypertension During Pregnancy Hypertension, or high blood pressure, is when there is extra pressure inside your blood vessels that carry blood from the heart to the rest of your body (arteries). It can happen at any time in life, including pregnancy. Hypertension during pregnancy can cause problems for you and your baby. Your baby might not weigh as much as he or she should at  birth or might be born early (premature). Very bad cases of hypertension during pregnancy can be life-threatening.  Different types of hypertension can occur during pregnancy. These include:  Chronic hypertension. This happens when a woman has  hypertension before pregnancy and it continues during pregnancy.  Gestational hypertension. This is when hypertension develops during pregnancy.  Preeclampsia or toxemia of pregnancy. This is a very serious type of hypertension that develops only during pregnancy. It affects the whole body and can be very dangerous for both mother and baby.  Gestational hypertension and preeclampsia usually go away after your baby is born. Your blood pressure will likely stabilize within 6 weeks. Women who have hypertension during pregnancy have a greater chance of developing hypertension later in life or with future pregnancies. RISK FACTORS There are certain factors that make it more likely for you to develop hypertension during pregnancy. These include:  Having hypertension before pregnancy.  Having hypertension during a previous pregnancy.  Being overweight.  Being older than 40 years.  Being pregnant with more than one baby.  Having diabetes or kidney problems. SIGNS AND SYMPTOMS Chronic and gestational hypertension rarely cause symptoms. Preeclampsia has symptoms, which may include:  Increased protein in your urine. Your health care provider will check for this at every prenatal visit.  Swelling of your hands and face.  Rapid weight gain.  Headaches.  Visual changes.  Being bothered by light.  Abdominal pain, especially in the upper right area.  Chest pain.  Shortness of breath.  Increased reflexes.  Seizures. These occur with a more severe form of preeclampsia, called eclampsia. DIAGNOSIS  You may be diagnosed with hypertension during a regular prenatal exam. At each prenatal visit, you may have:  Your blood pressure checked.  A urine  test to check for protein in your urine. The type of hypertension you are diagnosed with depends on when you developed it. It also depends on your specific blood pressure reading.  Developing hypertension before 20 weeks of pregnancy is consistent with chronic hypertension.  Developing hypertension after 20 weeks of pregnancy is consistent with gestational hypertension.  Hypertension with increased urinary protein is diagnosed as preeclampsia.  Blood pressure measurements that stay above 0000000 systolic or [id number] diastolic are a sign of severe preeclampsia. TREATMENT Treatment for hypertension during pregnancy varies. Treatment depends on the type of hypertension and how serious it is.  If you take medicine for chronic hypertension, you may need to switch medicines.  Medicines called ACE inhibitors should not be taken during pregnancy.  Low-dose aspirin may be suggested for women who have risk factors for preeclampsia.  If you have gestational hypertension, you may need to take a blood pressure medicine that is safe during pregnancy. Your health care provider will recommend the correct medicine.  If you have severe preeclampsia, you may need to be in the hospital. Health care providers will watch you and your baby very closely. You also may need to take medicine called magnesium sulfate to prevent seizures and lower blood pressure.  Sometimes, an early delivery is needed. This may be the case if the condition worsens. It would be done to protect you and your baby. The only cure for preeclampsia is delivery.  Your health care provider may recommend that you take one low-dose aspirin (81 mg) each day to help prevent high blood pressure during your pregnancy if you are at risk for preeclampsia. You may be at risk for preeclampsia if:  You had preeclampsia or eclampsia during a previous pregnancy.  Your baby did not grow as expected during a previous pregnancy.  You experienced preterm birth  with a previous pregnancy.  You experienced a separation of the placenta from the uterus (placental abruption) during a  previous pregnancy.  You experienced the loss of your baby during a previous pregnancy.  You are pregnant with more than one baby.  You have other medical conditions, such as diabetes or an autoimmune disease. HOME CARE INSTRUCTIONS  Schedule and keep all of your regular prenatal care appointments. This is important.  Take medicines only as directed by your health care provider. Tell your health care provider about all medicines you take.  Eat as little salt as possible.  Get regular exercise.  Do not drink alcohol.  Do not use tobacco products.  Do not drink products with caffeine.  Lie on your left side when resting. SEEK IMMEDIATE MEDICAL CARE IF:  You have severe abdominal pain.  You have sudden swelling in your hands, ankles, or face.  You gain 4 pounds (1.8 kg) or more in 1 week.  You vomit repeatedly.  You have vaginal bleeding.  You do not feel your baby moving as much.  You have a headache.  You have blurred or double vision.  You have muscle twitching or spasms.  You have shortness of breath.  You have blue fingernails or lips.  You have blood in your urine. MAKE SURE YOU:  Understand these instructions.  Will watch your condition.  Will get help right away if you are not doing well or get worse.   This information is not intended to replace advice given to you by your health care provider. Make sure you discuss any questions you have with your health care provider.   Document Released: 11/30/2010 Document Revised: 04/04/2014 Document Reviewed: 10/11/2012 Elsevier Interactive Patient Education [employer].

## 2015-10-27 ENCOUNTER — Encounter (HOSPITAL_COMMUNITY): Payer: Self-pay

## 2015-10-27 ENCOUNTER — Inpatient Hospital Stay (HOSPITAL_COMMUNITY): Admitting: Anesthesiology

## 2015-10-27 ENCOUNTER — Inpatient Hospital Stay (HOSPITAL_COMMUNITY)
Admission: AD | Admit: 2015-10-27 | Discharge: 2015-10-29 | DRG: 775 | Disposition: A | Discharge status: Home / Self Care | Source: Ambulatory Visit | Attending: Obstetrics | Admitting: Obstetrics

## 2015-10-27 DIAGNOSIS — F329 Major depressive disorder, single episode, unspecified: Secondary | ICD-10-CM | POA: Diagnosis present

## 2015-10-27 DIAGNOSIS — O134 Gestational [pregnancy-induced] hypertension without significant proteinuria, complicating childbirth: Secondary | ICD-10-CM | POA: Diagnosis present

## 2015-10-27 DIAGNOSIS — K219 Gastro-esophageal reflux disease without esophagitis: Secondary | ICD-10-CM | POA: Diagnosis present

## 2015-10-27 DIAGNOSIS — O9962 Diseases of the digestive system complicating childbirth: Secondary | ICD-10-CM | POA: Diagnosis present

## 2015-10-27 DIAGNOSIS — O403XX0 Polyhydramnios, third trimester, not applicable or unspecified: Secondary | ICD-10-CM | POA: Diagnosis present

## 2015-10-27 DIAGNOSIS — O99344 Other mental disorders complicating childbirth: Secondary | ICD-10-CM | POA: Diagnosis present

## 2015-10-27 DIAGNOSIS — Z3A38 38 weeks gestation of pregnancy: Secondary | ICD-10-CM

## 2015-10-27 DIAGNOSIS — Z3490 Encounter for supervision of normal pregnancy, unspecified, unspecified trimester: Secondary | ICD-10-CM

## 2015-10-27 LAB — COMPREHENSIVE METABOLIC PANEL
ALT: 12 U/L — ABNORMAL LOW (ref 14–54)
AST: 18 U/L (ref 15–41)
Albumin: 2.9 g/dL — ABNORMAL LOW (ref 3.5–5.0)
Alkaline Phosphatase: 113 U/L (ref 38–126)
Anion gap: 7 (ref 5–15)
BUN: 8 mg/dL (ref 6–20)
CO2: 22 mmol/L (ref 22–32)
Calcium: 9 mg/dL (ref 8.9–10.3)
Chloride: 104 mmol/L (ref 101–111)
Creatinine, Ser: 0.59 mg/dL (ref 0.44–1.00)
GFR calc Af Amer: >60 mL/min (ref >60)
GFR calc non Af Amer: >60 mL/min (ref >60)
Glucose, Bld: 104 mg/dL — ABNORMAL HIGH (ref 65–99)
Potassium: 3.7 mmol/L (ref 3.5–5.1)
Sodium: 133 mmol/L — ABNORMAL LOW (ref 135–145)
Total Bilirubin: 0.9 mg/dL (ref 0.3–1.2)
Total Protein: 6.2 g/dL — ABNORMAL LOW (ref 6.5–8.1)

## 2015-10-27 LAB — CBC
HCT: 39.7 % (ref 36.0–46.0)
Hemoglobin: 14 g/dL (ref 12.0–15.0)
MCH: 31.3 pg (ref 26.0–34.0)
MCHC: 35.3 g/dL (ref 30.0–36.0)
MCV: 88.6 fL (ref 78.0–100.0)
Platelets: 206 10*3/uL (ref 150–400)
RBC: 4.48 MIL/uL (ref 3.87–5.11)
RDW: 12.7 % (ref 11.5–15.5)
WBC: 9.1 10*3/uL (ref 4.0–10.5)

## 2015-10-27 LAB — TYPE AND SCREEN
ABO/RH(D): O POS
Antibody Screen: NEGATIVE

## 2015-10-27 LAB — LACTATE DEHYDROGENASE: LDH: 111 U/L (ref 98–192)

## 2015-10-27 LAB — OB RESULTS CONSOLE GBS: GBS: NEGATIVE

## 2015-10-27 LAB — ABO/RH: ABO/RH(D): O POS

## 2015-10-27 LAB — URIC ACID: Uric Acid, Serum: 5.5 mg/dL (ref 2.3–6.6)

## 2015-10-27 MED ORDER — LACTATED RINGERS IV SOLN: 500.0000 mL | INTRAVENOUS | Status: DC | PRN

## 2015-10-27 MED ORDER — PHENYLEPHRINE 40 MCG/ML (10ML) SYRINGE FOR IV PUSH (FOR BLOOD PRESSURE SUPPORT)
80.0000 ug | PREFILLED_SYRINGE | INTRAVENOUS | Status: DC | PRN
  Filled 2015-10-27: qty 5
  Filled 2015-10-27: qty 10

## 2015-10-27 MED ORDER — EPHEDRINE 5 MG/ML INJ
10.0000 mg | INTRAVENOUS | Status: DC | PRN
  Filled 2015-10-27: qty 4

## 2015-10-27 MED ORDER — FENTANYL 2.5 MCG/ML BUPIVACAINE 1/10 % EPIDURAL INFUSION (WH - ANES)
14.0000 mL/h | INTRAMUSCULAR | Status: DC | PRN
  Administered 2015-10-27 (×2): 14 mL/h via EPIDURAL
  Filled 2015-10-27: qty 125

## 2015-10-27 MED ORDER — ONDANSETRON HCL 4 MG/2ML IJ SOLN: 4.0000 mg | Freq: Four times a day (QID) | INTRAMUSCULAR | Status: DC | PRN

## 2015-10-27 MED ORDER — LIDOCAINE HCL (PF) 1 % IJ SOLN
30.0000 mL | INTRAMUSCULAR | Status: DC | PRN
  Filled 2015-10-27: qty 30

## 2015-10-27 MED ORDER — LIDOCAINE HCL (PF) 1 % IJ SOLN
INTRAMUSCULAR | Status: DC | PRN
  Administered 2015-10-27: 5 mL via EPIDURAL
  Administered 2015-10-27: 2 mL via EPIDURAL
  Administered 2015-10-27: 3 mL via EPIDURAL

## 2015-10-27 MED ORDER — IBUPROFEN 600 MG PO TABS
600.0000 mg | ORAL_TABLET | Freq: Four times a day (QID) | ORAL | Status: DC
  Administered 2015-10-28 – 2015-10-29 (×6): 600 mg via ORAL
  Filled 2015-10-27 (×6): qty 1

## 2015-10-27 MED ORDER — ACETAMINOPHEN 325 MG PO TABS
650.0000 mg | ORAL_TABLET | ORAL | Status: DC | PRN
  Filled 2015-10-27: qty 2

## 2015-10-27 MED ORDER — OXYCODONE-ACETAMINOPHEN 5-325 MG PO TABS: 1.0000 | ORAL_TABLET | ORAL | Status: DC | PRN

## 2015-10-27 MED ORDER — OXYCODONE-ACETAMINOPHEN 5-325 MG PO TABS: 2.0000 | ORAL_TABLET | ORAL | Status: DC | PRN

## 2015-10-27 MED ORDER — LACTATED RINGERS IV SOLN
500.0000 mL | Freq: Once | INTRAVENOUS | Status: AC
Start: 2015-10-27 — End: 2015-10-27
  Administered 2015-10-27: 500 mL via INTRAVENOUS

## 2015-10-27 MED ORDER — OXYTOCIN 40 UNITS IN LACTATED RINGERS INFUSION - SIMPLE MED: 2.5000 [IU]/h | INTRAVENOUS | Status: DC

## 2015-10-27 MED ORDER — LACTATED RINGERS IV SOLN
INTRAVENOUS | Status: DC
  Administered 2015-10-27 (×2): via INTRAVENOUS

## 2015-10-27 MED ORDER — DIPHENHYDRAMINE HCL 50 MG/ML IJ SOLN
12.5000 mg | INTRAMUSCULAR | Status: DC | PRN
  Administered 2015-10-27 (×2): 12.5 mg via INTRAVENOUS
  Filled 2015-10-27: qty 1

## 2015-10-27 MED ORDER — LACTATED RINGERS IV SOLN: 500.0000 mL | Freq: Once | INTRAVENOUS | Status: DC

## 2015-10-27 MED ORDER — OXYTOCIN BOLUS FROM INFUSION: 500.0000 mL | Freq: Once | INTRAVENOUS | Status: DC

## 2015-10-27 MED ORDER — PHENYLEPHRINE 40 MCG/ML (10ML) SYRINGE FOR IV PUSH (FOR BLOOD PRESSURE SUPPORT)
80.0000 ug | PREFILLED_SYRINGE | INTRAVENOUS | Status: DC | PRN
  Filled 2015-10-27: qty 10
  Filled 2015-10-27: qty 5

## 2015-10-27 MED ORDER — SOD CITRATE-CITRIC ACID 500-334 MG/5ML PO SOLN: 30.0000 mL | ORAL | Status: DC | PRN

## 2015-10-27 MED ORDER — OXYTOCIN 40 UNITS IN LACTATED RINGERS INFUSION - SIMPLE MED
1.0000 m[IU]/min | INTRAVENOUS | Status: DC
  Administered 2015-10-27: 2 m[IU]/min via INTRAVENOUS
  Filled 2015-10-27: qty 1000

## 2015-10-27 MED ORDER — TERBUTALINE SULFATE 1 MG/ML IJ SOLN
0.2500 mg | Freq: Once | INTRAMUSCULAR | Status: DC | PRN
  Filled 2015-10-27: qty 1

## 2015-10-27 NOTE — Anesthesia Procedure Notes (Signed)
Epidural Patient location during procedure: OB  Staffing Anesthesiologist: Catalina Gravel Performed: anesthesiologist   Preanesthetic Checklist Completed: patient identified, pre-op evaluation, timeout performed, IV checked, risks and benefits discussed and monitors and equipment checked  Epidural Patient position: sitting Prep: DuraPrep Patient monitoring: blood pressure and continuous pulse ox Approach: midline Location: L3-L4 Injection technique: LOR air  Needle:  Needle type: Tuohy  Needle gauge: 17 G Needle length: 9 cm Needle insertion depth: 4 cm Catheter size: 19 Gauge Catheter at skin depth: 8 cm Test dose: negative and Other (1% Lidocaine)  Additional Notes Patient identified.  Risk benefits discussed including failed block, incomplete pain control, headache, nerve damage, paralysis, blood pressure changes, nausea, vomiting, reactions to medication both toxic or allergic, and postpartum back pain.  Patient expressed understanding and wished to proceed.  All questions were answered.  Sterile technique used throughout procedure and epidural site dressed with sterile barrier dressing. No paresthesia or other complications noted. The patient did not experience any signs of intravascular injection such as tinnitus or metallic taste in mouth nor signs of intrathecal spread such as rapid motor block. Please see nursing notes for vital signs. Reason for block:procedure for pain

## 2015-10-27 NOTE — Anesthesia Preprocedure Evaluation (Signed)
Anesthesia Evaluation  Patient identified by MRN, date of birth, ID band Patient awake    Reviewed: Allergy & Precautions, NPO status , Patient's Chart, lab work & pertinent test results  Airway Mallampati: I  TM Distance: >3 FB Neck ROM: Full    Dental  (+) Teeth Intact, Dental Advisory Given   Pulmonary neg pulmonary ROS,    Pulmonary exam normal breath sounds clear to auscultation       Cardiovascular Exercise Tolerance: Good hypertension (GHTN), Normal cardiovascular exam Rhythm:Regular Rate:Normal     Neuro/Psych PSYCHIATRIC DISORDERS Anxiety Depression negative neurological ROS     GI/Hepatic negative GI ROS, Neg liver ROS,   Endo/Other  negative endocrine ROS  Renal/GU negative Renal ROS     Musculoskeletal negative musculoskeletal ROS (+)   Abdominal   Peds  Hematology negative hematology ROS (+) Plt 206k   Anesthesia Other Findings Day of surgery medications reviewed with the patient.  Reproductive/Obstetrics (+) Pregnancy                             Anesthesia Physical Anesthesia Plan  ASA: II  Anesthesia Plan: Epidural   Post-op Pain Management:    Induction:   Airway Management Planned:   Additional Equipment:   Intra-op Plan:   Post-operative Plan:   Informed Consent: I have reviewed the patients History and Physical, chart, labs and discussed the procedure including the risks, benefits and alternatives for the proposed anesthesia with the patient or authorized representative who has indicated his/her understanding and acceptance.   Dental advisory given  Plan Discussed with:   Anesthesia Plan Comments: (Patient identified. Risks/Benefits/Options discussed with patient including but not limited to bleeding, infection, nerve damage, paralysis, failed block, incomplete pain control, headache, blood pressure changes, nausea, vomiting, reactions to medication both  or allergic, itching and postpartum back pain. Confirmed with bedside nurse the patient's most recent platelet count. Confirmed with patient that they are not currently taking any anticoagulation, have any bleeding history or any family history of bleeding disorders. Patient expressed understanding and wished to proceed. All questions were answered. )        Anesthesia Quick Evaluation

## 2015-10-27 NOTE — Progress Notes (Signed)
S: Doing well, no complaints, pain well controlled as contractions not yet strong, planning epidural. No HA  O: BP 128/89   Pulse 74   Temp 98.3 F (36.8 C) (Oral)   Resp 16   Ht 5\' 7"  (1.702 m)   Wt 74.4 kg (164 lb)   BMI 25.69 kg/m    FHT:  FHR: 130s bpm, variability: moderate,  accelerations:  Present,  decelerations:  Absent UC:   Irregular, on pitocin SVE:   Dilation: 3 Effacement (%): 70 Station: Ballotable Exam by:: Pamala Hurry, MD  AROM clear   A / P:  25 y.o.  Obstetric History   G3   P0   T0   P0   A2   L0    SAB0   TAB0   Ectopic0   Multiple0   Live Births0    at [redacted]w[redacted]d IOL, gest htn, no sx, nl labs, bp stable  Fetal Wellbeing:  Category I Pain Control:  Labor support without medications  Anticipated MOD:  NSVD  Ammar Moffatt A. 10/27/2015, 5:08 PM

## 2015-10-27 NOTE — Anesthesia Pain Management Evaluation Note (Signed)
  CRNA Pain Management Visit Note  Patient: Claudia Rodriguez, 25 y.o., female  "Hello I am a member of the anesthesia team at [facility]. We have an anesthesia team available at all times to provide care throughout the hospital, including epidural management and anesthesia for C-section. I don't know your plan for the delivery whether it a natural birth, water birth, IV sedation, nitrous supplementation, doula or epidural, but we want to meet your pain goals."   1.Was your pain managed to your expectations on prior hospitalizations?   No prior hospitalizations  2.What is your expectation for pain management during this hospitalization?     Labor support without medications and epidural possibly  3.How can we help you reach that goal? Support and be available to provide epidural if pt. decides  Record the patient's initial score and the patient's pain goal.   Pain: 7  Pain Goal: 9 [facility] wants you to be able to say your pain was always managed very well.  Everette Rank 10/27/2015

## 2015-10-27 NOTE — H&P (Signed)
Claudia Rodriguez is a 25 y.o. G3P0020 at [redacted]w[redacted]d presenting for induction of labor due to gestational hypertension. Pt notes onset contractions over the weekend. Patient notes continued contractions over the last 4 days with limited rest and sleep. Patient has presented to MAU on Saturday and again on Sunday in latent labor with no significant cervical change. Blood pressures have been mildly elevated blood labs negative for preeclampsia. Patient presented to the office yesterday for evaluation with blood pressure of 138/90 and presented again today for evaluation with blood pressure 118/94. These elevated diastolics are in the setting of a patient with previously normal blood pressures. Patient notes mild headache but no scotomata. Patient reports fatigue from lack of sleep. No right upper quadrant pain. Good fetal movement, no leakage of fluid, no vaginal bleeding  [facility] first trimester - depression. On Zoloft GERD. On protonix - Gestational hypertension without severe features. Developed at 38 weeks. Will recommend move towards delivery - Elevated diabetic screen at 179, normal 3 hour - polyhydramnios. AFI 27 yesterday   Prenatal Transfer Tool  Maternal Diabetes: No Genetic Screening: Normal Maternal Ultrasounds/Referrals: Normal Fetal Ultrasounds or other Referrals:  None Maternal Substance Abuse:  No Significant Maternal Medications:  None Significant Maternal Lab Results: None     OB History    Gravida Para Term Preterm AB Living   3       2     SAB TAB Ectopic Multiple Live Births                 History reviewed. No pertinent past medical history. Past Surgical History:  Procedure Laterality Date  . INDUCED ABORTION     twice   Family History: family history is not on file. Social History:  reports that she has never smoked. She has never used smokeless tobacco. She reports that she does not drink alcohol or use drugs.  Review of Systems - Negative  except Continued contractions   Dilation: 3 Effacement (%): 50 Station: -3 Exam by:: William Hamburger, RN Blood pressure 118/75, pulse 77, temperature 99 F (37.2 C), temperature source Oral, resp. rate 20, height 5\' 7"  (1.702 m), weight 74.4 kg (164 lb).  Physical Exam:  Office BP 118/94, yesterday BP 138/90  Gen: well appearing, no distress CV:  RRR Pulm : CTAB Back: no CVAT Abd: gravid, NT, no RUQ pain LE: trace edema, equal bilaterally, non-tender Toco: none  FH: baseline 130s, accelerations present, no deceleratons, 10 beat variability  Prenatal labs: ABO, Rh: --/--/O POS (08/01 1205) Antibody: NEG (08/01 1205) Rubella: !Error!immune RPR: Nonreactive (01/20 0000)  HBsAg: Negative (01/20 0000)  HIV: Non-reactive (01/20 0000)  GBS: Negative (08/01 0000)  1 hr Glucola 179, nl 3 hr  Genetic screening normal quad  Anatomy US normal  CBC    Component Value Date/Time   WBC 9.1 10/27/2015 1205   RBC 4.48 10/27/2015 1205   HGB 14.0 10/27/2015 1205   HCT 39.7 10/27/2015 1205   PLT 206 10/27/2015 1205   MCV 88.6 10/27/2015 1205   MCH 31.3 10/27/2015 1205   MCHC 35.3 10/27/2015 1205   RDW 12.7 10/27/2015 1205    CMP     Component Value Date/Time   NA 133 (L) 10/27/2015 1205   K 3.7 10/27/2015 1205   CL 104 10/27/2015 1205   CO2 22 10/27/2015 1205   GLUCOSE 104 (H) 10/27/2015 1205   BUN 8 10/27/2015 1205   CREATININE 0.59 10/27/2015 1205   CALCIUM 9.0 10/27/2015  1205   PROT 6.2 (L) 10/27/2015 1205   ALBUMIN 2.9 (L) 10/27/2015 1205   AST 18 10/27/2015 1205   ALT 12 (L) 10/27/2015 1205   ALKPHOS 113 10/27/2015 1205   BILITOT 0.9 10/27/2015 1205   GFRNONAA >60 10/27/2015 1205   GFRAA >60 10/27/2015 1205      Assessment/Plan: 25 y.o. G3P0020 at [redacted]w[redacted]d Gestational hypertension, not severe, given >37 wks will move to delivery. Plan pitocin IOL, pt aware increase c/s risks with IOL. Repeat labs on admit GBS neg Reactive fetal testing Depression, continue  Zoloft Polyhydramnios   Reinhold Rickey A. 10/27/2015, 2:55 PM

## 2015-10-28 ENCOUNTER — Encounter (HOSPITAL_COMMUNITY): Payer: Self-pay | Admitting: Obstetrics and Gynecology

## 2015-10-28 LAB — CBC
HCT: 35.6 % — ABNORMAL LOW (ref 36.0–46.0)
Hemoglobin: 12.5 g/dL (ref 12.0–15.0)
MCH: 30.9 pg (ref 26.0–34.0)
MCHC: 35.1 g/dL (ref 30.0–36.0)
MCV: 88.1 fL (ref 78.0–100.0)
Platelets: 161 10*3/uL (ref 150–400)
RBC: 4.04 MIL/uL (ref 3.87–5.11)
RDW: 12.5 % (ref 11.5–15.5)
WBC: 12 10*3/uL — ABNORMAL HIGH (ref 4.0–10.5)

## 2015-10-28 LAB — RPR: RPR Ser Ql: NONREACTIVE

## 2015-10-28 MED ORDER — FLEET ENEMA 7-19 GM/118ML RE ENEM: 1.0000 | ENEMA | Freq: Every day | RECTAL | Status: DC | PRN

## 2015-10-28 MED ORDER — SIMETHICONE 80 MG PO CHEW: 80.0000 mg | CHEWABLE_TABLET | ORAL | Status: DC | PRN

## 2015-10-28 MED ORDER — DIPHENHYDRAMINE HCL 25 MG PO CAPS: 25.0000 mg | ORAL_CAPSULE | Freq: Four times a day (QID) | ORAL | Status: DC | PRN

## 2015-10-28 MED ORDER — ZOLPIDEM TARTRATE 5 MG PO TABS: 5.0000 mg | ORAL_TABLET | Freq: Every evening | ORAL | Status: DC | PRN

## 2015-10-28 MED ORDER — BENZOCAINE-MENTHOL 20-0.5 % EX AERO
1.0000 "application " | INHALATION_SPRAY | CUTANEOUS | Status: DC | PRN
  Administered 2015-10-28: 1 via TOPICAL
  Filled 2015-10-28: qty 56

## 2015-10-28 MED ORDER — HYDROXYZINE HCL 25 MG PO TABS
25.0000 mg | ORAL_TABLET | Freq: Three times a day (TID) | ORAL | Status: DC | PRN
  Filled 2015-10-28: qty 1

## 2015-10-28 MED ORDER — TETANUS-DIPHTH-ACELL PERTUSSIS 5-2.5-18.5 LF-MCG/0.5 IM SUSP: 0.5000 mL | Freq: Once | INTRAMUSCULAR | Status: DC

## 2015-10-28 MED ORDER — ONDANSETRON HCL 4 MG/2ML IJ SOLN: 4.0000 mg | INTRAMUSCULAR | Status: DC | PRN

## 2015-10-28 MED ORDER — WITCH HAZEL-GLYCERIN EX PADS: 1.0000 "application " | MEDICATED_PAD | CUTANEOUS | Status: DC | PRN

## 2015-10-28 MED ORDER — PRENATAL MULTIVITAMIN CH
1.0000 | ORAL_TABLET | Freq: Every day | ORAL | Status: DC
  Administered 2015-10-28: 1 via ORAL
  Filled 2015-10-28: qty 1

## 2015-10-28 MED ORDER — DIBUCAINE 1 % RE OINT
1.0000 "application " | TOPICAL_OINTMENT | RECTAL | Status: DC | PRN
  Administered 2015-10-28: 1 via RECTAL
  Filled 2015-10-28: qty 28

## 2015-10-28 MED ORDER — ACETAMINOPHEN 325 MG PO TABS
650.0000 mg | ORAL_TABLET | ORAL | Status: DC | PRN
  Administered 2015-10-28: 650 mg via ORAL
  Filled 2015-10-28: qty 2

## 2015-10-28 MED ORDER — ONDANSETRON HCL 4 MG PO TABS: 4.0000 mg | ORAL_TABLET | ORAL | Status: DC | PRN

## 2015-10-28 MED ORDER — SERTRALINE HCL 50 MG PO TABS
50.0000 mg | ORAL_TABLET | Freq: Every day | ORAL | Status: DC
  Administered 2015-10-28 (×2): 50 mg via ORAL
  Filled 2015-10-28 (×3): qty 1

## 2015-10-28 MED ORDER — COCONUT OIL OIL: 1.0000 "application " | TOPICAL_OIL | Status: DC | PRN

## 2015-10-28 MED ORDER — SENNOSIDES-DOCUSATE SODIUM 8.6-50 MG PO TABS
2.0000 | ORAL_TABLET | ORAL | Status: DC
Start: 2015-10-28 — End: 2015-10-29
  Administered 2015-10-28 (×2): 2 via ORAL
  Filled 2015-10-28 (×2): qty 2

## 2015-10-28 MED ORDER — BISACODYL 10 MG RE SUPP: 10.0000 mg | Freq: Every day | RECTAL | Status: DC | PRN

## 2015-10-28 NOTE — Progress Notes (Signed)
Patient ID: Claudia Rodriguez, female   DOB: [birth year], 25 y.o.   MRN: [id number] PPD # 1 SVD  S:  Reports feeling well.             Tolerating po/ No nausea or vomiting             Bleeding is light             Pain controlled with ibuprofen (OTC)             Up ad lib / ambulatory / voiding without difficulties    Newborn  Information for the patient's newborn:  Kelisha, Turnquist [id number]  female  breast feeding  / Circumcision NOT planning   O:  A & O x 3, in no apparent distress              VS:  Vitals:   10/27/15 2346 10/28/15 0045 10/28/15 0140 10/28/15 0544  BP: 122/76 125/72 121/62 104/76  Pulse: 78 80 70 72  Resp:  18 18 18   Temp:  98.7 F (37.1 C) 98.1 F (36.7 C) 98.3 F (36.8 C)  TempSrc:  Oral Oral   SpO2:      Weight:      Height:        LABS:  Recent Labs  10/27/15 1205 10/28/15 0548  WBC 9.1 12.0*  HGB 14.0 12.5  HCT 39.7 35.6*  PLT 206 161    Blood type: O POS (08/01 1205)  Rubella: Immune (01/20 0000)   I&O: I/O last 3 completed shifts: In: -  Out: 350 [Blood:350]          No intake/output data recorded.  Lungs: Clear and unlabored  Heart: regular rate and rhythm / no murmurs  Abdomen: soft, non-tender, non-distended             Fundus: firm, non-tender, U-1  Perineum: 2nd degree repair healing well, No edema  Lochia: minimal  Extremities: No edema, no calf pain or tenderness, No Homans    A/P: PPD # 1 25 y.o., [id number]   Principal Problem:    Postpartum care following vaginal delivery (8/1)   Doing well - stable status  Routine post partum orders  Anticipate discharge tomorrow    Laury Deep, M, MSN, CNM 10/28/2015, 9:03 AM

## 2015-10-28 NOTE — Anesthesia Postprocedure Evaluation (Signed)
Anesthesia Post Note  Patient: Claudia Rodriguez  Procedure(s) Performed: * No procedures listed *  Patient location during evaluation: Mother Baby Anesthesia Type: Epidural Level of consciousness: awake and alert and oriented Pain management: satisfactory to patient Vital Signs Assessment: post-procedure vital signs reviewed and stable Respiratory status: spontaneous breathing and nonlabored ventilation Cardiovascular status: stable Postop Assessment: no headache, no backache, no signs of nausea or vomiting, adequate PO intake and patient able to bend at knees (patient up walking) Anesthetic complications: no     Last Vitals:  Vitals:   10/28/15 0140 10/28/15 0544  BP: 121/62 104/76  Pulse: 70 72  Resp: 18 18  Temp: 36.7 C 36.8 C    Last Pain:  Vitals:   10/28/15 0608  TempSrc:   PainSc: 2    Pain Goal:                 Willa Rough

## 2015-10-28 NOTE — Lactation Note (Signed)
This note was copied from a baby's chart. Lactation Consultation Note  Patient Name: Claudia Rodriguez Jann [id number] Date: 10/28/2015 Reason for consult: Initial assessment Initial visit made.  Breastfeeding consultation services and support information given and reviewed.  This is mom's second baby but first time breastfeeding.  Mom states baby has latched but mainly very sleepy.  Discussed first 24 hours of newborn behavior and cluster feeding on day 2-3.  Encouraged to call for assist/concerns.  Maternal Data    Feeding    LATCH Score/Interventions                      Lactation Tools Discussed/Used     Consult Status Consult Status: Follow-up Date: 10/29/15 Follow-up type: In-patient    Ave Filter 10/28/2015, 11:55 AM

## 2015-10-28 NOTE — Progress Notes (Signed)
MOB was referred for history of depression/anxiety.  Referral is screened out by Clinical Social Worker because none of the following criteria appear to apply: -History of anxiety/depression during this pregnancy, or of post-partum depression. - Diagnosis of anxiety and/or depression within last 3 years - History of depression due to pregnancy loss/loss of child or -MOB's symptoms are currently being treated with medication and/or therapy.  Mother is currently on Zoloft 50mg .  MOB's symptoms have been managed throughout pregnancy.   Please contact the Clinical Social Worker if needs arise or upon MOB request.   Lane Hacker, MSW Clinical Social Work: System Print production planner for [employer] social worker [phone]

## 2015-10-29 MED ORDER — COCONUT OIL OIL: 1.0000 "application " | TOPICAL_OIL | 0 refills | Status: AC | PRN

## 2015-10-29 MED ORDER — ACETAMINOPHEN 325 MG PO TABS: 650.0000 mg | ORAL_TABLET | ORAL | Status: DC | PRN

## 2015-10-29 MED ORDER — IBUPROFEN 600 MG PO TABS: 600.0000 mg | ORAL_TABLET | Freq: Four times a day (QID) | ORAL | 0 refills | Status: AC

## 2015-10-29 NOTE — Discharge Instructions (Signed)
Breast Pumping Tips °If you are breastfeeding, there may be times when you cannot feed your baby directly. Returning to work or going on a trip are common examples. Pumping allows you to store breast milk and feed it to your baby later.  °You may not get much milk when you first start to pump. Your breasts should start to make more after a few days. If you pump at the times you usually feed your baby, you may be able to keep making enough milk to feed your baby without also using formula. The more often you pump, the more milk you will produce.  °WHEN SHOULD I PUMP?  °· You can begin to pump soon after delivery. However, some experts recommend waiting about 4 weeks before giving your infant a bottle to make sure breastfeeding is going well.  °· If you plan to return to work, begin pumping a few weeks before. This will help you develop techniques that work best for you. It also lets you build up a supply of breast milk.   °· When you are with your infant, feed on demand and pump after each feeding.   °· When you are away from your infant for several hours, pump for about 15 minutes every 2-3 hours. Pump both breasts at the same time if you can.   °· If your infant has a formula feeding, make sure to pump around the same time.     °· If you drink any alcohol, wait 2 hours before pumping.   °HOW DO I PREPARE TO PUMP? °Your let-down reflex is the natural reaction to stimulation that makes your breast milk flow. It is easier to stimulate this reflex when you are relaxed. Find relaxation techniques that work for you. If you have difficulty with your let-down reflex, try these methods:  °· Smell one of your infant's blankets or an item of clothing.   °· Look at a picture or video of your infant.   °· Sit in a quiet, private space.   °· Massage the breast you plan to pump.   °· Place soothing warmth on the breast.   °· Play relaxing music.   °WHAT ARE SOME GENERAL BREAST PUMPING TIPS? °· Wash your hands before you pump. You  do not need to wash your nipples or breasts. °· There are three ways to pump. °¨ You can use your hand to massage and compress your breast. °¨ You can use a handheld manual pump. °¨ You can use an electric pump.   °· Make sure the suction cup (flange) on the breast pump is the right size. Place the flange directly over the nipple. If it is the wrong size or placed the wrong way, it may be painful and cause nipple damage.   °· If pumping is uncomfortable, apply a small amount of purified or modified lanolin to your nipple and areola. °· If you are using an electric pump, adjust the speed and suction power to be more comfortable. °· If pumping is painful or if you are not getting very much milk, you may need a different type of pump. A lactation consultant can help you determine what type of pump to use.   °· Keep a full water bottle near you at all times. Drinking lots of fluid helps you make more milk.  °· You can store your milk to use later. Pumped breast milk can be stored in a sealable, sterile container or plastic bag. Label all stored breast milk with the date you pumped it. °¨ Milk can stay out at room temperature for up to 8 hours. °¨   You can store your milk in the refrigerator for up to 8 days.  You can store your milk in the freezer for 3 months. Thaw frozen milk using warm water. Do not put it in the microwave.  Do not smoke. Smoking can lower your milk supply and harm your infant. If you need help quitting, ask your health care provider to recommend a program.  [place] A LACTATION CONSULTANT?  You are having trouble pumping.  You are concerned that you are not making enough milk.  You have nipple pain, soreness, or redness.  You want to use birth control. Birth control pills may lower your milk supply. Talk to your health care provider about your options.   This information is not intended to replace advice given to you by your health care provider.  Make sure you discuss any questions you have with your health care provider.   Document Released: 09/01/2009 Document Revised: 03/19/2013 Document Reviewed: 01/04/2013 Elsevier Interactive Patient Education [employer].

## 2015-10-29 NOTE — Discharge Summary (Signed)
Obstetric Discharge Summary Reason for Admission: induction of labor and gestational hypertension Prenatal Procedures: ultrasound and preeclampsia labs Intrapartum Procedures: spontaneous vaginal delivery Postpartum Procedures: none Complications-Operative and Postpartum: 2nd degree perineal laceration and periurethral Hemoglobin  Date Value Ref Range Status  10/28/2015 12.5 12.0 - 15.0 g/dL Final   HCT  Date Value Ref Range Status  10/28/2015 35.6 (L) 36.0 - 46.0 % Final    Physical Exam:  General: alert, cooperative and no distress Lochia: appropriate Uterine Fundus: firm Incision: healing well DVT Evaluation: No evidence of DVT seen on physical exam. No cords or calf tenderness.  Discharge Diagnoses: Term Pregnancy-delivered  Discharge Information: Date: 10/29/2015 Activity: pelvic rest Diet: routine Medications: PNV and Ibuprofen Condition: stable Instructions: refer to practice specific booklet Discharge to: home Follow-up Information    [facility] A., MD. Schedule an appointment as soon as possible for a visit in 6 week(s).   Specialty:  Obstetrics and Gynecology Why:  Postpartum care Contact information: [place] [address] [phone]           Newborn Data: Live born female "Leanna Sato" Birth Weight: 6 lb 9.8 oz (3000 g) APGAR: 9, 9  Home with mother.  Juliene Pina, CNM 10/29/2015, 11:31 AM   Agree with above note with exception: pt did not have a periurethral laceration.   Maryori Weide A. 11/01/2015 3:15 PM

## 2015-10-29 NOTE — Lactation Note (Signed)
This note was copied from a baby's chart. Lactation Consultation Note  Mother recently breastfed for 20 min.  Attempted re-latch but mother has to void. Mother called insurance company to get DEBP.  She will continue to post pump to supplement feedings. Breastfeeding has improved and baby is latching for longer periods and mother likes side lying position. Reviewed engorgement care and monitoring voids/stools. Suggest she call for further assistance.  Patient Name: Claudia Rodriguez [id number] Date: 10/29/2015 Reason for consult: Follow-up assessment   Maternal Data    Feeding Feeding Type: Breast Fed Length of feed: 20 min  LATCH Score/Interventions                      Lactation Tools Discussed/Used     Consult Status Consult Status: Complete    Carlye Grippe 10/29/2015, 10:50 AM

## 2015-10-29 NOTE — Progress Notes (Signed)
Patient ID: Claudia Rodriguez, female   DOB: 07-27-1990, 25 y.o.   MRN: [id number] Post Partum Day #2            Information for the patient's newborn:  Adwita, Funke [id number]  female   / circumcision declined Feeding: breast  Subjective: No HA, SOB, CP, F/C, breast symptoms. Pain minimal. Normal vaginal bleeding, no clots.      Objective:  Temp:  [97.7 F (36.5 C)-98.6 F (37 C)] 98.6 F (37 C) (08/03 0615) Pulse Rate:  [72-82] 76 (08/03 0615) Resp:  [18-19] 18 (08/03 0615) BP: (121-133)/(75-96) 127/85 (08/03 0615)  No intake or output data in the 24 hours ending 10/29/15 1127     Recent Labs  10/27/15 1205 10/28/15 0548  WBC 9.1 12.0*  HGB 14.0 12.5  HCT 39.7 35.6*  PLT 206 161    Blood type: --/--/O POS, O POS (08/01 1205) Rubella: Immune (01/20 0000)    Physical Exam:  General: alert, cooperative and no distress Uterine Fundus: firm Lochia: appropriate Perineum: repair intact, 2nd deg perineal and periurethral, edema none DVT Evaluation: No evidence of DVT seen on physical exam. No significant calf/ankle edema.    Assessment/Plan: PPD # 2 / 25 y.o., [id number] S/P:induced vaginal   Principal Problem:   Postpartum care following vaginal delivery (8/1) Gestational Hypertension - resolving, no s/s PEC    normal postpartum exam  Continue current postpartum care  D/C home   LOS: 2 days   Juliene Pina, CNM, MSN 10/29/2015, 11:27 AM

## 2017-03-15 DIAGNOSIS — D352 Benign neoplasm of pituitary gland: Secondary | ICD-10-CM | POA: Diagnosis not present

## 2017-03-15 DIAGNOSIS — E221 Hyperprolactinemia: Secondary | ICD-10-CM | POA: Diagnosis not present

## 2017-03-20 DIAGNOSIS — D352 Benign neoplasm of pituitary gland: Secondary | ICD-10-CM | POA: Diagnosis not present

## 2017-05-30 DIAGNOSIS — Z3200 Encounter for pregnancy test, result unknown: Secondary | ICD-10-CM | POA: Diagnosis not present

## 2017-05-30 DIAGNOSIS — R109 Unspecified abdominal pain: Secondary | ICD-10-CM | POA: Diagnosis not present

## 2017-05-30 DIAGNOSIS — R1032 Left lower quadrant pain: Secondary | ICD-10-CM | POA: Diagnosis not present

## 2017-06-01 DIAGNOSIS — R109 Unspecified abdominal pain: Secondary | ICD-10-CM | POA: Diagnosis not present

## 2018-01-19 DIAGNOSIS — Z23 Encounter for immunization: Secondary | ICD-10-CM | POA: Diagnosis not present

## 2018-02-06 DIAGNOSIS — Z1151 Encounter for screening for human papillomavirus (HPV): Secondary | ICD-10-CM | POA: Diagnosis not present

## 2018-02-06 DIAGNOSIS — Z6824 Body mass index (BMI) 24.0-24.9, adult: Secondary | ICD-10-CM | POA: Diagnosis not present

## 2018-02-06 DIAGNOSIS — Z01419 Encounter for gynecological examination (general) (routine) without abnormal findings: Secondary | ICD-10-CM | POA: Diagnosis not present

## 2018-02-06 DIAGNOSIS — R87810 Cervical high risk human papillomavirus (HPV) DNA test positive: Secondary | ICD-10-CM | POA: Diagnosis not present

## 2018-02-06 DIAGNOSIS — N940 Mittelschmerz: Secondary | ICD-10-CM | POA: Diagnosis not present

## 2018-02-14 DIAGNOSIS — J111 Influenza due to unidentified influenza virus with other respiratory manifestations: Secondary | ICD-10-CM | POA: Diagnosis not present

## 2018-06-04 DIAGNOSIS — H20011 Primary iridocyclitis, right eye: Secondary | ICD-10-CM | POA: Diagnosis not present

## 2018-06-28 DIAGNOSIS — R109 Unspecified abdominal pain: Secondary | ICD-10-CM | POA: Diagnosis not present

## 2018-06-28 DIAGNOSIS — Z1329 Encounter for screening for other suspected endocrine disorder: Secondary | ICD-10-CM | POA: Diagnosis not present

## 2018-06-28 DIAGNOSIS — N915 Oligomenorrhea, unspecified: Secondary | ICD-10-CM | POA: Diagnosis not present

## 2018-06-28 DIAGNOSIS — N644 Mastodynia: Secondary | ICD-10-CM | POA: Diagnosis not present

## 2018-06-28 DIAGNOSIS — N943 Premenstrual tension syndrome: Secondary | ICD-10-CM | POA: Diagnosis not present

## 2018-06-28 DIAGNOSIS — L709 Acne, unspecified: Secondary | ICD-10-CM | POA: Diagnosis not present

## 2018-08-07 DIAGNOSIS — L709 Acne, unspecified: Secondary | ICD-10-CM | POA: Diagnosis not present

## 2018-08-07 DIAGNOSIS — N915 Oligomenorrhea, unspecified: Secondary | ICD-10-CM | POA: Diagnosis not present

## 2018-08-07 DIAGNOSIS — N943 Premenstrual tension syndrome: Secondary | ICD-10-CM | POA: Diagnosis not present

## 2018-08-07 DIAGNOSIS — R109 Unspecified abdominal pain: Secondary | ICD-10-CM | POA: Diagnosis not present

## 2018-08-31 DIAGNOSIS — Z309 Encounter for contraceptive management, unspecified: Secondary | ICD-10-CM | POA: Diagnosis not present

## 2018-08-31 DIAGNOSIS — N926 Irregular menstruation, unspecified: Secondary | ICD-10-CM | POA: Diagnosis not present

## 2018-09-04 DIAGNOSIS — L723 Sebaceous cyst: Secondary | ICD-10-CM | POA: Diagnosis not present

## 2018-09-04 DIAGNOSIS — L700 Acne vulgaris: Secondary | ICD-10-CM | POA: Diagnosis not present

## 2018-10-29 DIAGNOSIS — Z20828 Contact with and (suspected) exposure to other viral communicable diseases: Secondary | ICD-10-CM | POA: Diagnosis not present

## 2018-11-29 DIAGNOSIS — L739 Follicular disorder, unspecified: Secondary | ICD-10-CM | POA: Diagnosis not present

## 2018-11-29 DIAGNOSIS — L813 Cafe au lait spots: Secondary | ICD-10-CM | POA: Diagnosis not present

## 2018-11-29 DIAGNOSIS — L723 Sebaceous cyst: Secondary | ICD-10-CM | POA: Diagnosis not present

## 2018-11-29 DIAGNOSIS — L814 Other melanin hyperpigmentation: Secondary | ICD-10-CM | POA: Diagnosis not present

## 2019-01-23 DIAGNOSIS — Z23 Encounter for immunization: Secondary | ICD-10-CM | POA: Diagnosis not present

## 2019-03-14 DIAGNOSIS — Z1329 Encounter for screening for other suspected endocrine disorder: Secondary | ICD-10-CM | POA: Diagnosis not present

## 2019-03-14 DIAGNOSIS — Z01419 Encounter for gynecological examination (general) (routine) without abnormal findings: Secondary | ICD-10-CM | POA: Diagnosis not present

## 2019-03-14 DIAGNOSIS — Z130 Encounter for screening for diseases of the blood and blood-forming organs and certain disorders involving the immune mechanism: Secondary | ICD-10-CM | POA: Diagnosis not present

## 2019-03-14 DIAGNOSIS — Z131 Encounter for screening for diabetes mellitus: Secondary | ICD-10-CM | POA: Diagnosis not present

## 2019-03-14 DIAGNOSIS — Z0000 Encounter for general adult medical examination without abnormal findings: Secondary | ICD-10-CM | POA: Diagnosis not present

## 2019-03-14 DIAGNOSIS — Z6826 Body mass index (BMI) 26.0-26.9, adult: Secondary | ICD-10-CM | POA: Diagnosis not present

## 2019-03-14 DIAGNOSIS — Z13220 Encounter for screening for lipoid disorders: Secondary | ICD-10-CM | POA: Diagnosis not present

## 2019-04-02 DIAGNOSIS — Z20828 Contact with and (suspected) exposure to other viral communicable diseases: Secondary | ICD-10-CM | POA: Diagnosis not present

## 2019-04-04 DIAGNOSIS — R05 Cough: Secondary | ICD-10-CM | POA: Diagnosis not present

## 2019-04-04 DIAGNOSIS — M791 Myalgia, unspecified site: Secondary | ICD-10-CM | POA: Diagnosis not present

## 2019-04-04 DIAGNOSIS — R0981 Nasal congestion: Secondary | ICD-10-CM | POA: Diagnosis not present

## 2019-05-30 DIAGNOSIS — E221 Hyperprolactinemia: Secondary | ICD-10-CM | POA: Diagnosis not present

## 2019-05-30 DIAGNOSIS — L680 Hirsutism: Secondary | ICD-10-CM | POA: Diagnosis not present

## 2019-05-30 DIAGNOSIS — D443 Neoplasm of uncertain behavior of pituitary gland: Secondary | ICD-10-CM | POA: Diagnosis not present

## 2019-05-30 DIAGNOSIS — R635 Abnormal weight gain: Secondary | ICD-10-CM | POA: Diagnosis not present

## 2019-06-03 ENCOUNTER — Other Ambulatory Visit: Payer: Self-pay | Admitting: Endocrinology

## 2019-06-03 DIAGNOSIS — D443 Neoplasm of uncertain behavior of pituitary gland: Secondary | ICD-10-CM | POA: Diagnosis not present

## 2019-06-03 DIAGNOSIS — D444 Neoplasm of uncertain behavior of craniopharyngeal duct: Secondary | ICD-10-CM

## 2019-06-04 ENCOUNTER — Other Ambulatory Visit: Payer: Self-pay | Admitting: Endocrinology

## 2019-06-04 DIAGNOSIS — N6310 Unspecified lump in the right breast, unspecified quadrant: Secondary | ICD-10-CM

## 2019-06-07 ENCOUNTER — Other Ambulatory Visit: Payer: Self-pay

## 2019-06-07 ENCOUNTER — Other Ambulatory Visit: Payer: Self-pay | Admitting: Endocrinology

## 2019-06-07 ENCOUNTER — Ambulatory Visit
Admission: RE | Admit: 2019-06-07 | Discharge: 2019-06-07 | Disposition: A | Discharge status: Home / Self Care | Payer: BC Managed Care – PPO | Source: Ambulatory Visit | Attending: Endocrinology | Admitting: Endocrinology

## 2019-06-07 DIAGNOSIS — R2231 Localized swelling, mass and lump, right upper limb: Secondary | ICD-10-CM

## 2019-06-07 DIAGNOSIS — N6310 Unspecified lump in the right breast, unspecified quadrant: Secondary | ICD-10-CM

## 2019-06-07 DIAGNOSIS — N644 Mastodynia: Secondary | ICD-10-CM | POA: Diagnosis not present

## 2019-06-07 IMAGING — US US AXILLARY RIGHT
1 series · 2 of 2 positions shown · non-contrast
Comparison: Previous exam(s).
COMPARISON: Previous exam(s).

Addendum:
CLINICAL DATA: Quarter sized tender mass felt by the patient in the
high axillary region the past 2 weeks. Family history of breast
cancer in a paternal aunt in her 30's.

EXAM:
ULTRASOUND OF THE RIGHT AXILLA

[Series 1: us axillary right · 0.06mm/px · 2 of 2 slices shown]
[im 1/2]
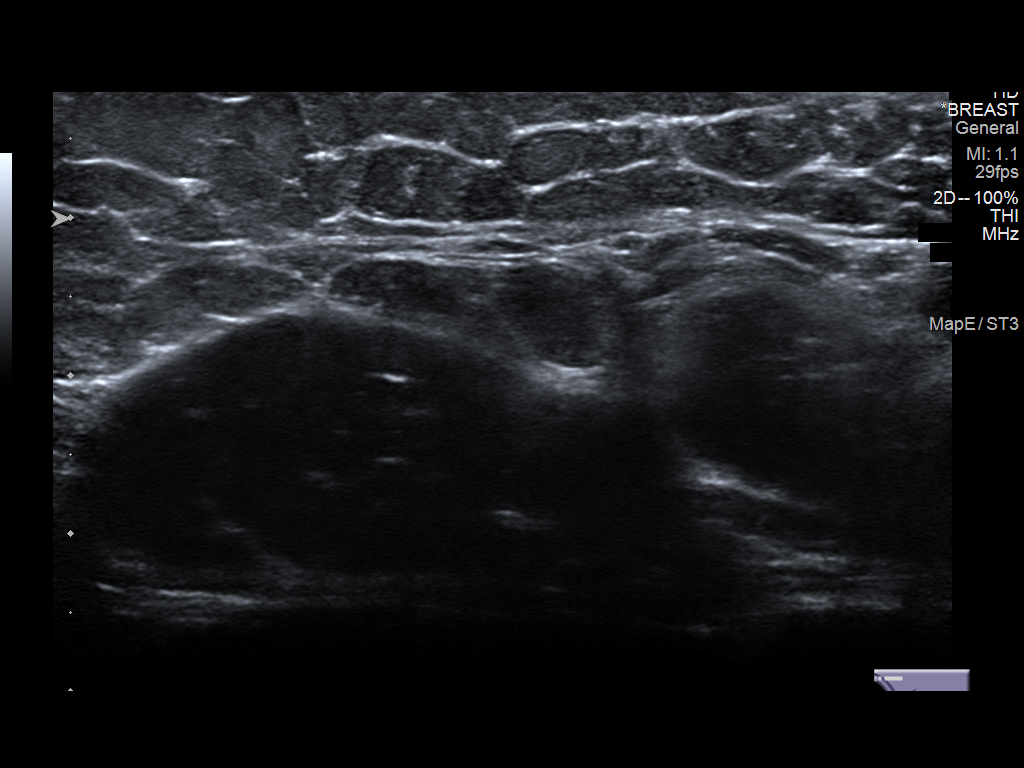
[im 2/2]
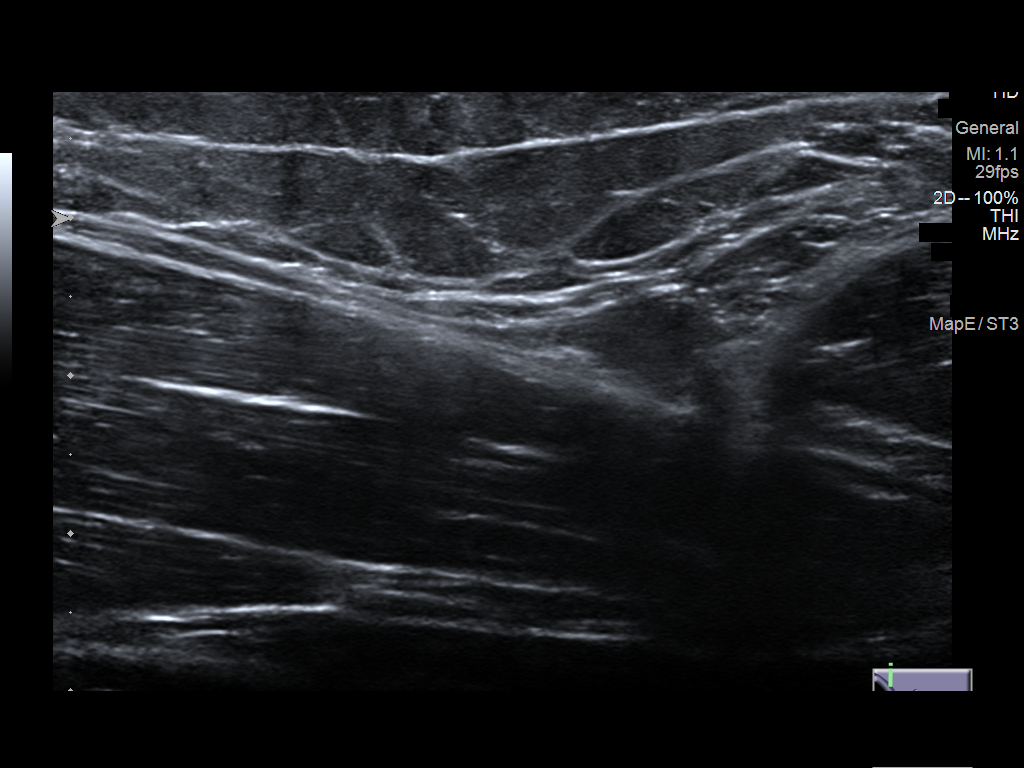

[2 of 2 positions shown; findings below may reference images not displayed]

FINDINGS: On physical exam, no mass is palpable in the upper right
axilla/upper arm at the location of the mass felt by the patient.

Targeted ultrasound is performed, showing normal appearing
subcutaneous fat and underlying muscles and tendons at the location
of patient concern. No mass or enlarged lymph nodes were seen.
IMPRESSION: Normal examination.  No mass or adenopathy demonstrated.

RECOMMENDATION:
Annual screening mammography beginning at age 40.

I have discussed the findings and recommendations with the patient.
If applicable, a reminder letter will be sent to the patient
regarding the next appointment.

BI-RADS CATEGORY  1: Negative.

ADDENDUM:
There were no previous examinations for comparison.

*** End of Addendum ***
FINDINGS: On physical exam, no mass is palpable in the upper right
axilla/upper arm at the location of the mass felt by the patient.

Targeted ultrasound is performed, showing normal appearing
subcutaneous fat and underlying muscles and tendons at the location
of patient concern. No mass or enlarged lymph nodes were seen.
IMPRESSION: Normal examination.  No mass or adenopathy demonstrated.

RECOMMENDATION:
Annual screening mammography beginning at age 40.

I have discussed the findings and recommendations with the patient.
If applicable, a reminder letter will be sent to the patient
regarding the next appointment.

BI-RADS CATEGORY  1: Negative.

## 2019-06-25 ENCOUNTER — Ambulatory Visit
Admission: RE | Admit: 2019-06-25 | Discharge: 2019-06-25 | Disposition: A | Discharge status: Home / Self Care | Payer: BC Managed Care – PPO | Source: Ambulatory Visit | Attending: Endocrinology | Admitting: Endocrinology

## 2019-06-25 ENCOUNTER — Other Ambulatory Visit: Payer: Self-pay

## 2019-06-25 DIAGNOSIS — D444 Neoplasm of uncertain behavior of craniopharyngeal duct: Secondary | ICD-10-CM

## 2019-06-25 DIAGNOSIS — D443 Neoplasm of uncertain behavior of pituitary gland: Secondary | ICD-10-CM

## 2019-06-25 DIAGNOSIS — D352 Benign neoplasm of pituitary gland: Secondary | ICD-10-CM | POA: Diagnosis not present

## 2019-06-25 IMAGING — MR MR HEAD WO/W CM
14 of 20 series · 33 of 48 positions shown · IV contrast (8ml multihance)
Comparison: None.

CLINICAL DATA: Pituitary adenoma, follow-up

EXAM:
MRI HEAD WITHOUT AND WITH CONTRAST
TECHNIQUE: Multiplanar, multiecho pulse sequences of the brain and surrounding
structures were obtained without and with intravenous contrast.
CONTRAST:  8mL MULTIHANCE GADOBENATE DIMEGLUMINE 529 MG/ML IV SOLN

[Series 2: T1 · sagittal · 5.0mm · 0.45mm/px · 3 of 19 slices shown]
[im 1/19]
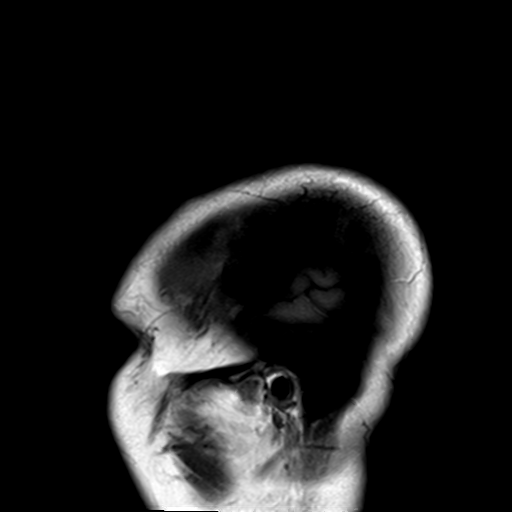
[im 10/19]
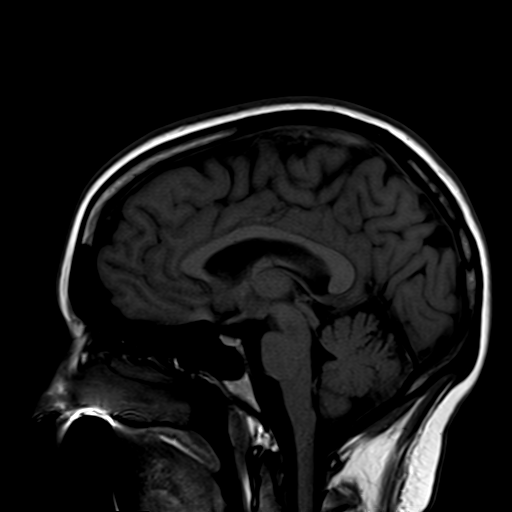
[im 19/19]
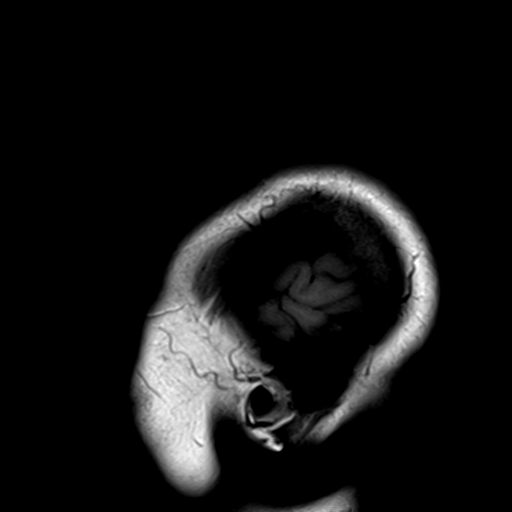

[Series 3: DWI · axial · 3.0mm · 1.80mm/px · z∈[-91,+56]mm · 8 of 100 slices shown]
[im 1/100]
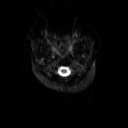
[im 12/100]
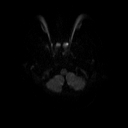
[im 34/100]
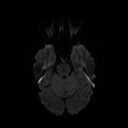
[im 45/100]
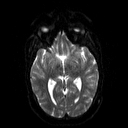
[im 56/100]
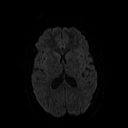
[im 67/100]
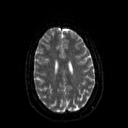
[im 89/100]
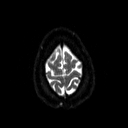
[im 100/100]
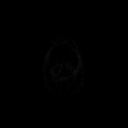

[Series 4: dwi_adc · axial · 3.0mm · 1.80mm/px · z∈[-91,+56]mm · 5 of 50 slices shown]
[im 1/50]
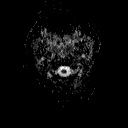
[im 13/50]
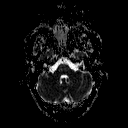
[im 25/50]
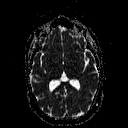
[im 37/50]
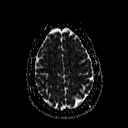
[im 50/50]
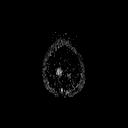

[Series 5: T2 · axial · 5.0mm · 0.36mm/px · z∈[-85,+51]mm · 2 of 22 slices shown]
[im 1/22]
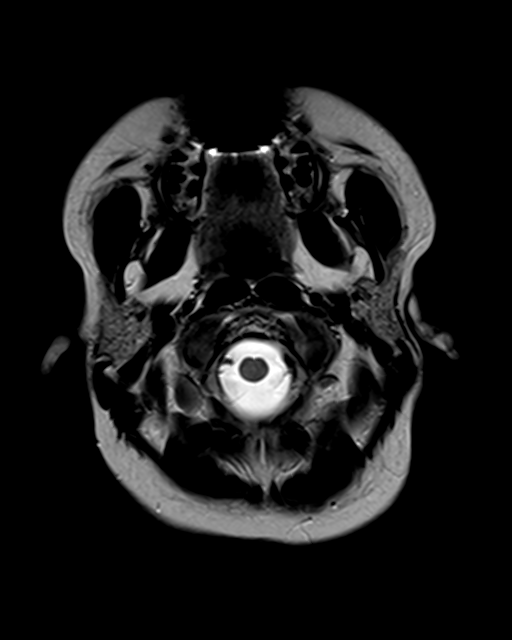
[im 22/22]
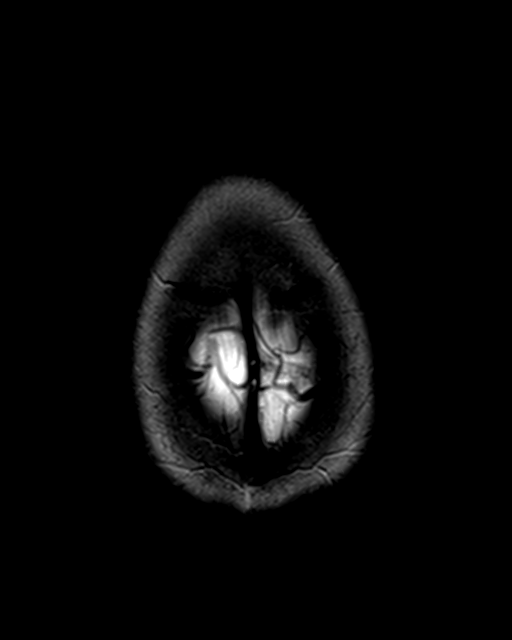

[Series 6: FLAIR · axial · 3.0mm · 0.45mm/px · z∈[-89,+54]mm · 3 of 32 slices shown (1 of 2)]
[im 1/32]
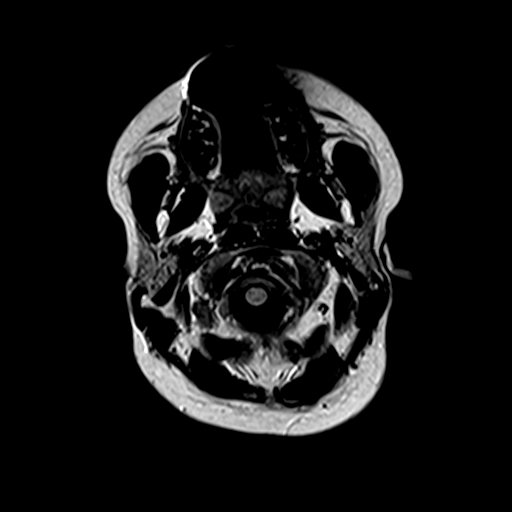
[im 16/32]
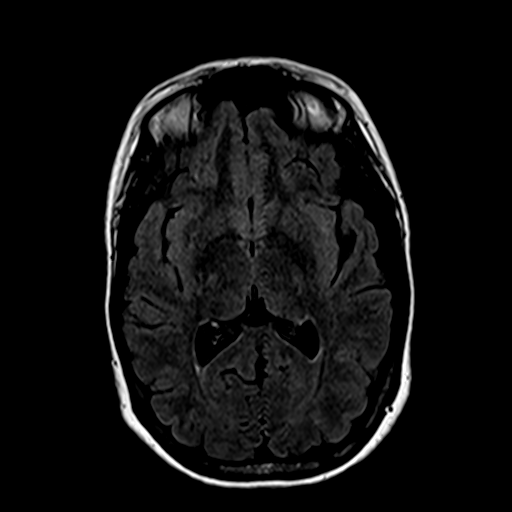
[im 32/32]
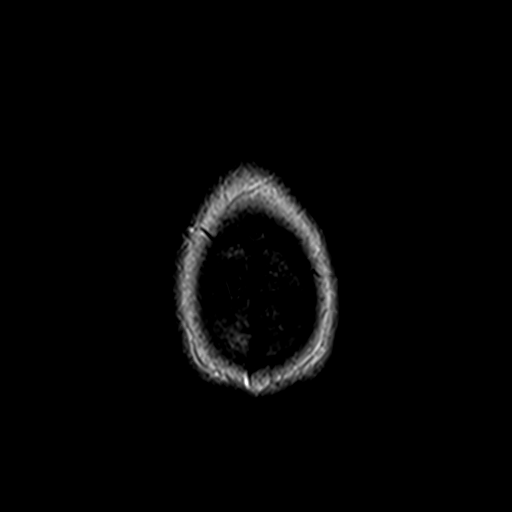

[Series 8: swi_images · axial · 4.0mm · 0.94mm/px · z∈[-88,+52]mm · 3 of 36 slices shown]
[im 1/36]
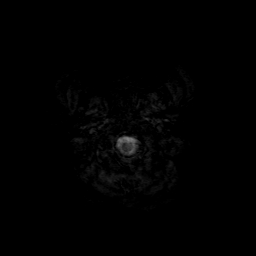
[im 18/36]
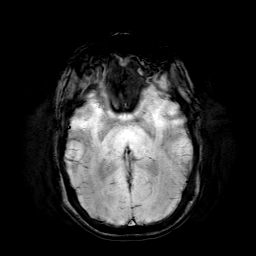
[im 36/36]
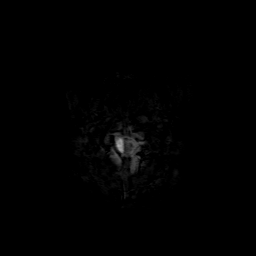

[Series 9: sag 3mm · sagittal · 3.0mm · 0.33mm/px · 1 of 11 slices shown]
[im 1/11]
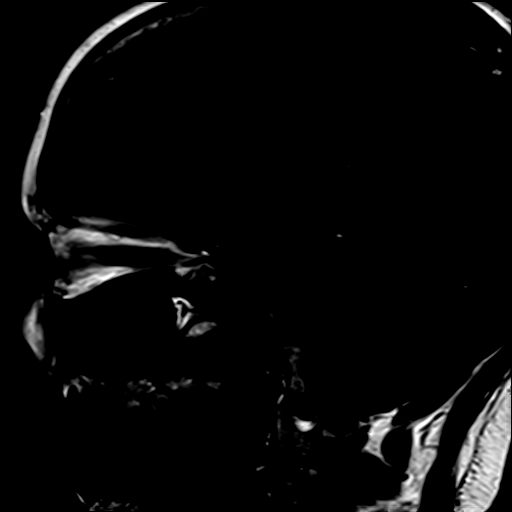

[Series 10: cor 3mm · coronal · 3.0mm · 0.33mm/px · 1 of 12 slices shown]
[im 1/12]
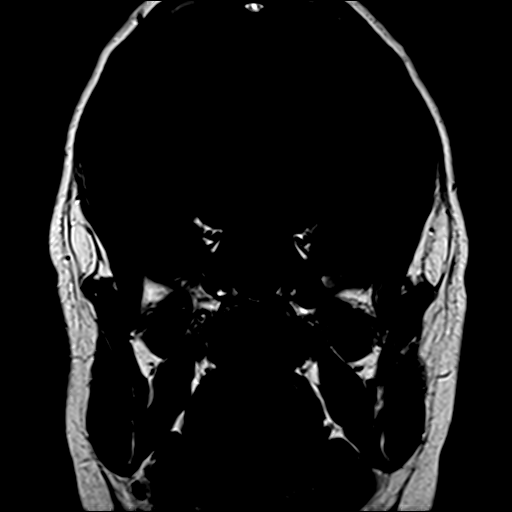

[Series 11: FLAIR · sagittal · 5.0mm · 0.45mm/px · 2 of 25 slices shown (2 of 2)]
[im 1/25]
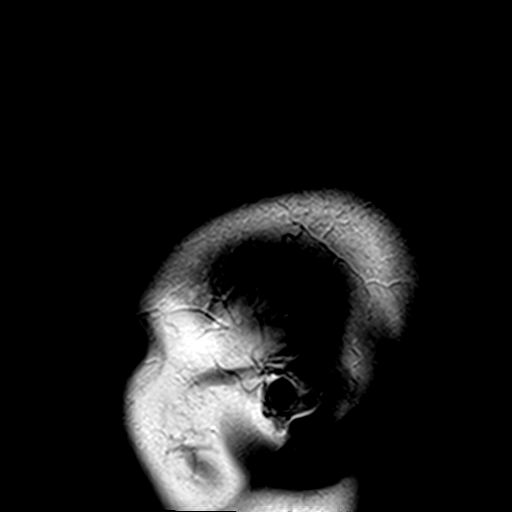
[im 25/25]
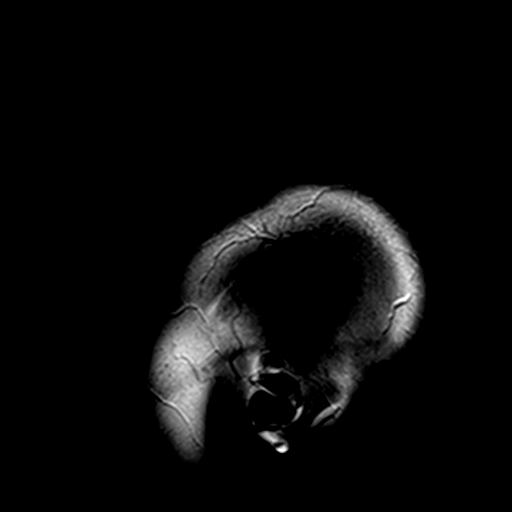

[Series 12: pre cor dynamic · coronal · non-contrast · 3.0mm · 0.35mm/px · 1 of 10 slices shown]
[im 1/10]
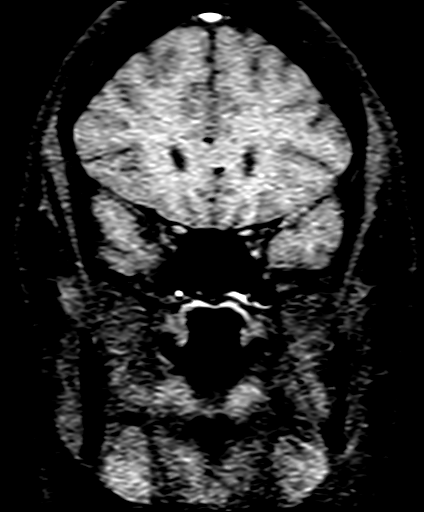

[Series 13: post fs cor · coronal · 3.0mm · 0.35mm/px · 1 of 10 slices shown (1 of 4)]
[im 1/10]
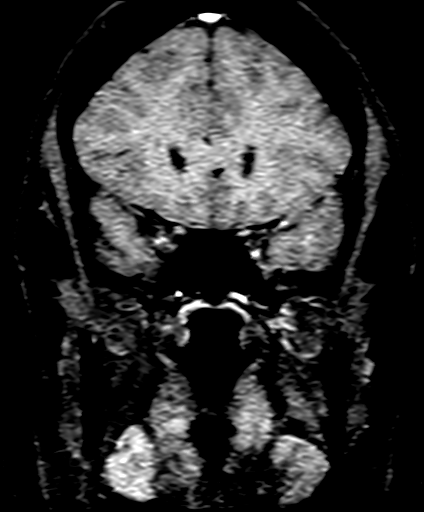

[Series 14: post fs cor · coronal · 3.0mm · 0.35mm/px · 1 of 10 slices shown (2 of 4)]
[im 1/10]
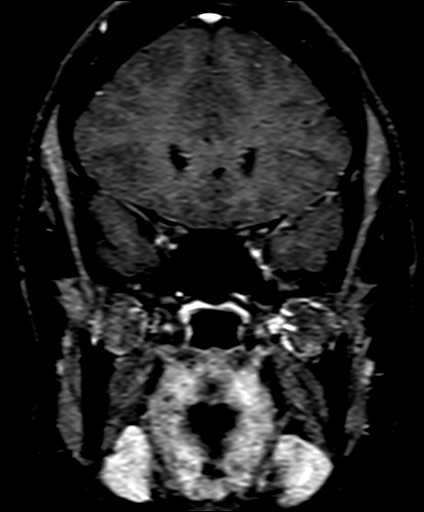

[Series 15: post fs cor · coronal · 3.0mm · 0.35mm/px · 1 of 10 slices shown (3 of 4)]
[im 1/10]
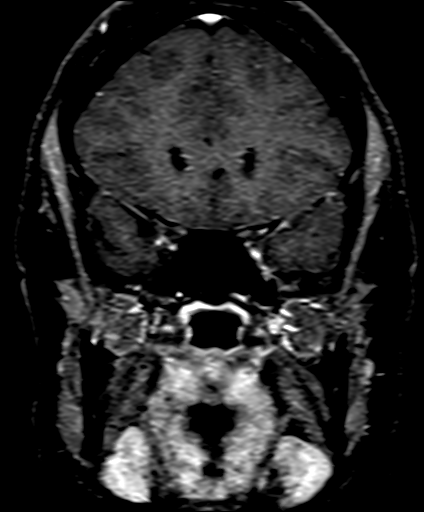

[Series 16: post fs cor · coronal · 3.0mm · 0.35mm/px · 1 of 10 slices shown (4 of 4)]
[im 1/10]
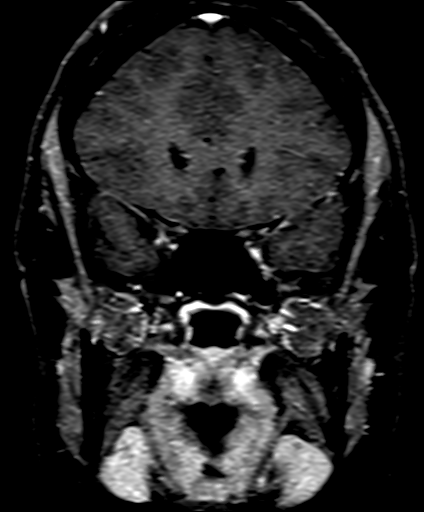

[33 of 48 positions shown; findings below may reference images not displayed]

FINDINGS: Brain: There is a 4 x 3 mm area of relative hypoenhancement at the
left aspect of the anterior pituitary seen on series 14, image 6.
Infundibulum is midline.

There is no acute infarction or intracranial hemorrhage. There is no
mass effect or edema. There is no hydrocephalus or extra-axial fluid
collection. Minimal small foci of T2 hyperintensity in the
supratentorial white matter likely reflecting nonspecific
gliosis/demyelination of doubtful clinical significance. No abnormal
parenchymal enhancement.

Vascular: Major vessel flow voids at the skull base are preserved.

Skull and upper cervical spine: Normal marrow signal is preserved.

Sinuses/Orbits: Paranasal sinuses are aerated. Orbits are
unremarkable.

Other: Mastoid air cells are clear.
IMPRESSION: Possible microadenoma at the left aspect of the anterior pituitary.
Correlate with prior imaging/report.

## 2019-06-25 MED ORDER — GADOBENATE DIMEGLUMINE 529 MG/ML IV SOLN
8.0000 mL | Freq: Once | INTRAVENOUS | Status: AC | PRN
  Administered 2019-06-25: 8 mL via INTRAVENOUS

## 2019-06-26 ENCOUNTER — Other Ambulatory Visit: Payer: BC Managed Care – PPO

## 2019-07-08 DIAGNOSIS — E221 Hyperprolactinemia: Secondary | ICD-10-CM | POA: Diagnosis not present

## 2019-07-08 DIAGNOSIS — L680 Hirsutism: Secondary | ICD-10-CM | POA: Diagnosis not present

## 2019-07-08 DIAGNOSIS — D443 Neoplasm of uncertain behavior of pituitary gland: Secondary | ICD-10-CM | POA: Diagnosis not present

## 2019-07-08 DIAGNOSIS — R635 Abnormal weight gain: Secondary | ICD-10-CM | POA: Diagnosis not present

## 2020-01-02 DIAGNOSIS — F411 Generalized anxiety disorder: Secondary | ICD-10-CM | POA: Diagnosis not present

## 2020-01-02 DIAGNOSIS — Z630 Problems in relationship with spouse or partner: Secondary | ICD-10-CM | POA: Diagnosis not present

## 2020-01-03 ENCOUNTER — Other Ambulatory Visit: Payer: Self-pay | Admitting: Endocrinology

## 2020-01-03 DIAGNOSIS — L680 Hirsutism: Secondary | ICD-10-CM | POA: Diagnosis not present

## 2020-01-03 DIAGNOSIS — R635 Abnormal weight gain: Secondary | ICD-10-CM | POA: Diagnosis not present

## 2020-01-03 DIAGNOSIS — E221 Hyperprolactinemia: Secondary | ICD-10-CM | POA: Diagnosis not present

## 2020-01-03 DIAGNOSIS — D443 Neoplasm of uncertain behavior of pituitary gland: Secondary | ICD-10-CM

## 2020-01-04 DIAGNOSIS — N83202 Unspecified ovarian cyst, left side: Secondary | ICD-10-CM | POA: Diagnosis not present

## 2020-01-04 DIAGNOSIS — R42 Dizziness and giddiness: Secondary | ICD-10-CM | POA: Diagnosis not present

## 2020-01-04 DIAGNOSIS — R0902 Hypoxemia: Secondary | ICD-10-CM | POA: Diagnosis not present

## 2020-01-04 DIAGNOSIS — N83292 Other ovarian cyst, left side: Secondary | ICD-10-CM | POA: Diagnosis not present

## 2020-01-04 DIAGNOSIS — R1032 Left lower quadrant pain: Secondary | ICD-10-CM | POA: Diagnosis not present

## 2020-01-04 DIAGNOSIS — R110 Nausea: Secondary | ICD-10-CM | POA: Diagnosis not present

## 2020-01-04 DIAGNOSIS — R1030 Lower abdominal pain, unspecified: Secondary | ICD-10-CM | POA: Diagnosis not present

## 2020-01-04 DIAGNOSIS — R1084 Generalized abdominal pain: Secondary | ICD-10-CM | POA: Diagnosis not present

## 2020-01-04 DIAGNOSIS — Z3202 Encounter for pregnancy test, result negative: Secondary | ICD-10-CM | POA: Diagnosis not present

## 2020-01-09 DIAGNOSIS — Z630 Problems in relationship with spouse or partner: Secondary | ICD-10-CM | POA: Diagnosis not present

## 2020-01-09 DIAGNOSIS — F411 Generalized anxiety disorder: Secondary | ICD-10-CM | POA: Diagnosis not present

## 2020-01-16 DIAGNOSIS — Z630 Problems in relationship with spouse or partner: Secondary | ICD-10-CM | POA: Diagnosis not present

## 2020-01-16 DIAGNOSIS — F411 Generalized anxiety disorder: Secondary | ICD-10-CM | POA: Diagnosis not present

## 2020-01-23 DIAGNOSIS — F411 Generalized anxiety disorder: Secondary | ICD-10-CM | POA: Diagnosis not present

## 2020-02-05 DIAGNOSIS — F908 Attention-deficit hyperactivity disorder, other type: Secondary | ICD-10-CM | POA: Diagnosis not present

## 2020-03-04 DIAGNOSIS — F908 Attention-deficit hyperactivity disorder, other type: Secondary | ICD-10-CM | POA: Diagnosis not present

## 2020-04-09 DIAGNOSIS — F901 Attention-deficit hyperactivity disorder, predominantly hyperactive type: Secondary | ICD-10-CM | POA: Diagnosis not present

## 2020-04-09 DIAGNOSIS — Z630 Problems in relationship with spouse or partner: Secondary | ICD-10-CM | POA: Diagnosis not present

## 2020-04-12 ENCOUNTER — Inpatient Hospital Stay: Admission: RE | Admit: 2020-04-12 | Payer: Self-pay | Source: Ambulatory Visit

## 2020-04-14 ENCOUNTER — Ambulatory Visit
Admission: RE | Admit: 2020-04-14 | Discharge: 2020-04-14 | Disposition: A | Discharge status: Home / Self Care | Payer: BLUE CROSS/BLUE SHIELD | Source: Ambulatory Visit | Attending: Endocrinology | Admitting: Endocrinology

## 2020-04-14 ENCOUNTER — Other Ambulatory Visit: Payer: Self-pay

## 2020-04-14 DIAGNOSIS — D352 Benign neoplasm of pituitary gland: Secondary | ICD-10-CM | POA: Diagnosis not present

## 2020-04-14 DIAGNOSIS — G9389 Other specified disorders of brain: Secondary | ICD-10-CM | POA: Diagnosis not present

## 2020-04-14 DIAGNOSIS — D443 Neoplasm of uncertain behavior of pituitary gland: Secondary | ICD-10-CM

## 2020-04-14 DIAGNOSIS — E237 Disorder of pituitary gland, unspecified: Secondary | ICD-10-CM | POA: Diagnosis not present

## 2020-04-14 IMAGING — MR MR HEAD WO/W CM
16 of 19 series · 34 of 48 positions shown · IV contrast (10 ml Multihance)
Comparison: MRI of the brain [DATE].

CLINICAL DATA: Pituitary adenoma.

EXAM:
MRI HEAD WITHOUT AND WITH CONTRAST
TECHNIQUE: Multiplanar, multiecho pulse sequences of the brain and surrounding
structures were obtained without and with intravenous contrast.
CONTRAST:  10mL MULTIHANCE GADOBENATE DIMEGLUMINE 529 MG/ML IV SOLN

[Series 2: T1 · sagittal · 5.0mm · 0.45mm/px · 1 of 19 slices shown]
[im 1/19]
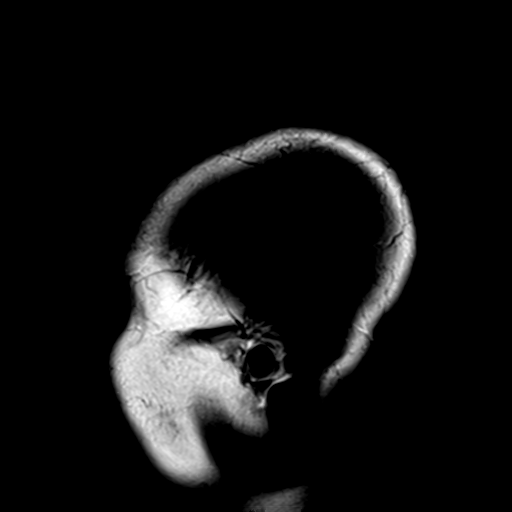

[Series 3: DWI · axial · 3.0mm · 1.80mm/px · z∈[-61,+86]mm · 8 of 100 slices shown]
[im 1/100]
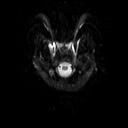
[im 12/100]
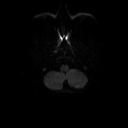
[im 34/100]
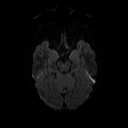
[im 45/100]
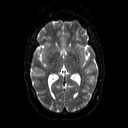
[im 56/100]
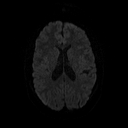
[im 67/100]
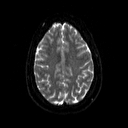
[im 89/100]
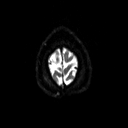
[im 100/100]
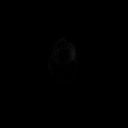

[Series 4: dwi_adc · axial · 3.0mm · 1.80mm/px · z∈[-61,+86]mm · 5 of 50 slices shown]
[im 1/50]
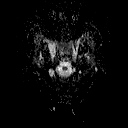
[im 13/50]
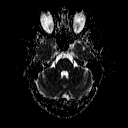
[im 25/50]
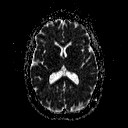
[im 37/50]
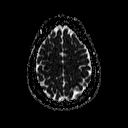
[im 50/50]
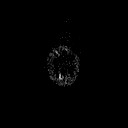

[Series 5: T2 · axial · 5.0mm · 0.36mm/px · z∈[-63,+86]mm · 3 of 24 slices shown]
[im 1/24]
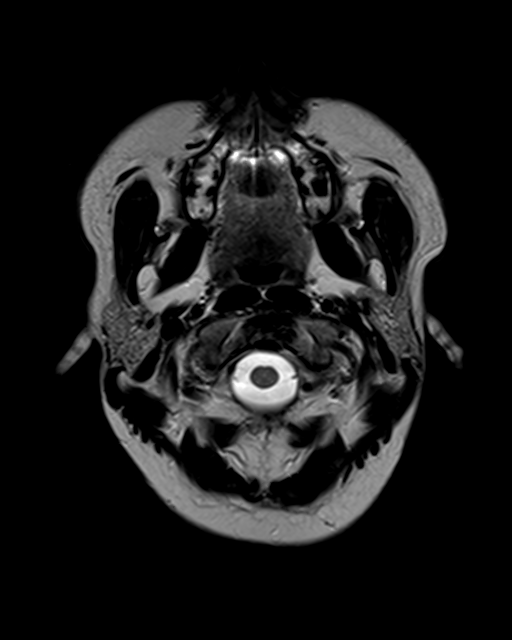
[im 12/24]
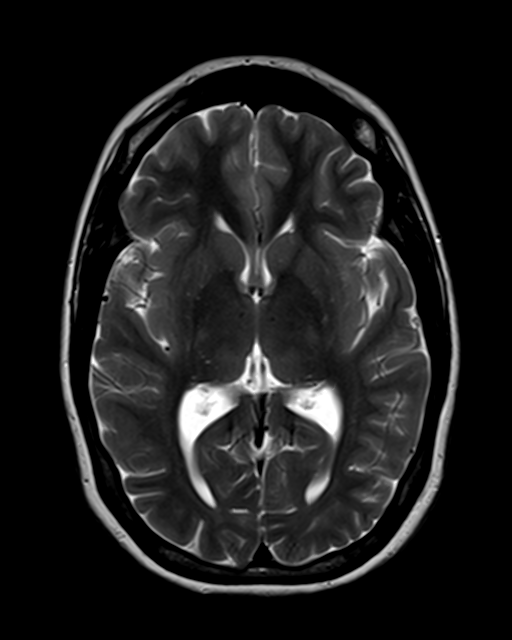
[im 24/24]
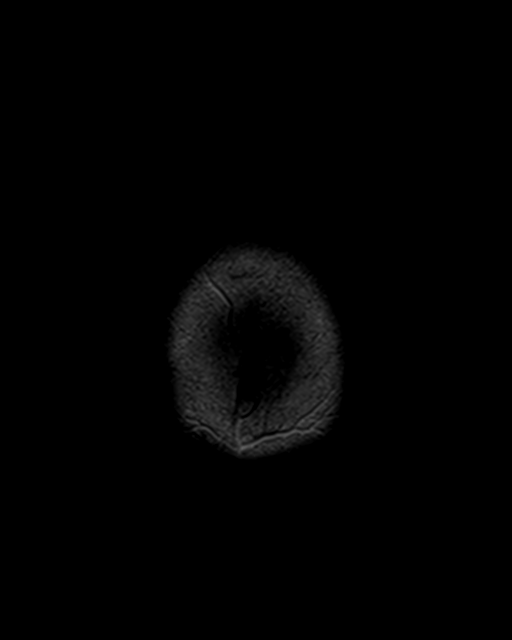

[Series 6: FLAIR · axial · 3.0mm · 0.45mm/px · z∈[-60,+84]mm · 3 of 32 slices shown]
[im 1/32]
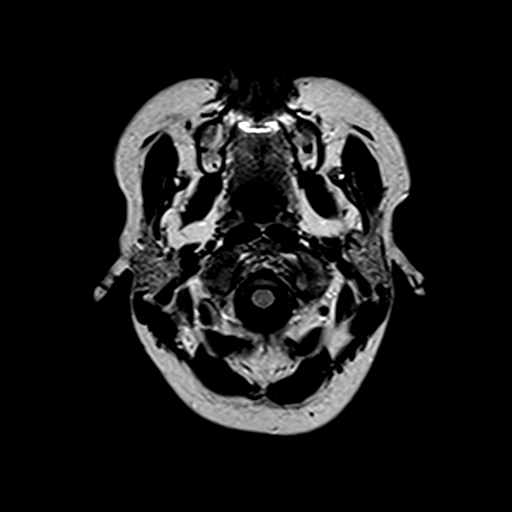
[im 16/32]
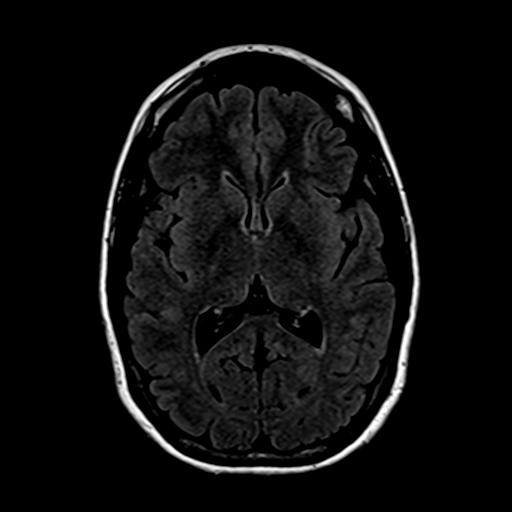
[im 32/32]
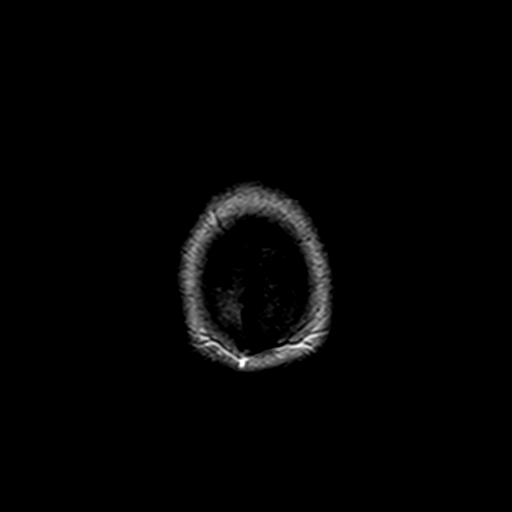

[Series 8: swi_images · axial · 4.0mm · 0.94mm/px · z∈[-58,+82]mm · 4 of 36 slices shown]
[im 1/36]
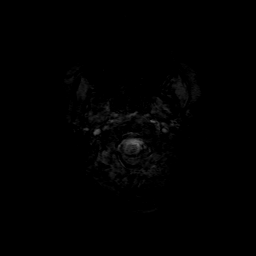
[im 12/36]
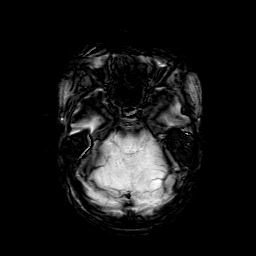
[im 24/36]
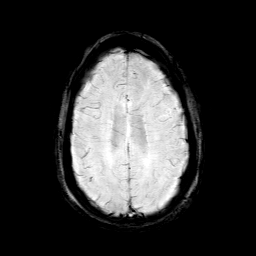
[im 36/36]
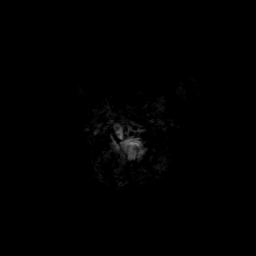

[Series 9: sag 3mm · sagittal · 3.0mm · 0.33mm/px · 1 of 11 slices shown]
[im 1/11]
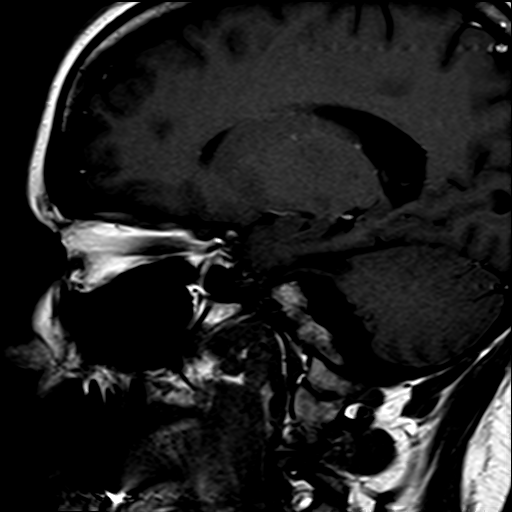

[Series 10: cor 3mm · coronal · 3.0mm · 0.33mm/px · 1 of 11 slices shown]
[im 1/11]
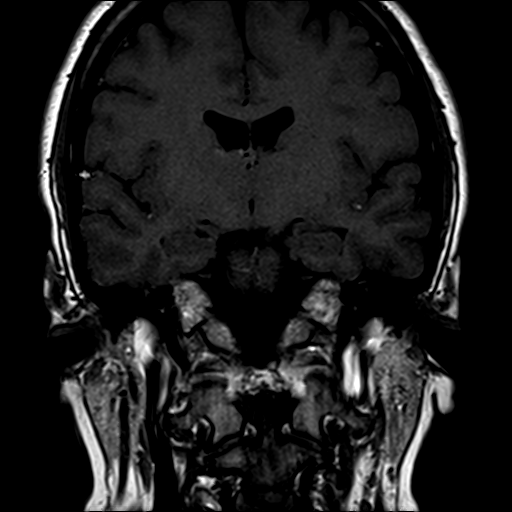

[Series 11: pre cor dynamic · coronal · non-contrast · 3.0mm · 0.35mm/px · 1 of 7 slices shown]
[im 1/7]
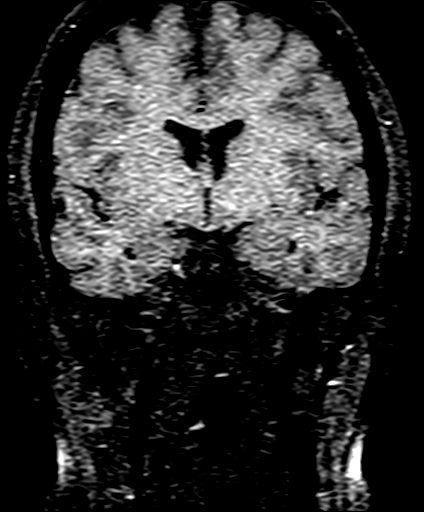

[Series 12: post fs cor · coronal · 3.0mm · 0.35mm/px · 1 of 7 slices shown (1 of 6)]
[im 1/7]
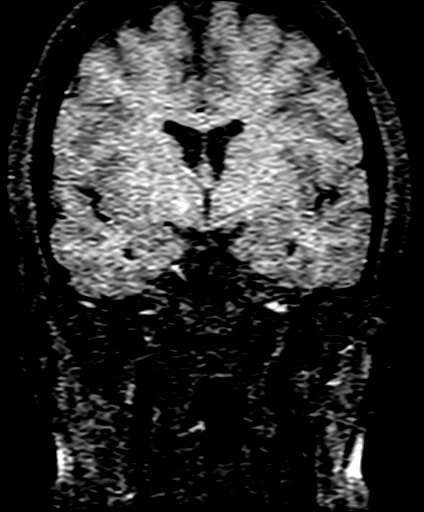

[Series 13: post fs cor · coronal · 3.0mm · 0.35mm/px · 1 of 7 slices shown (2 of 6)]
[im 1/7]
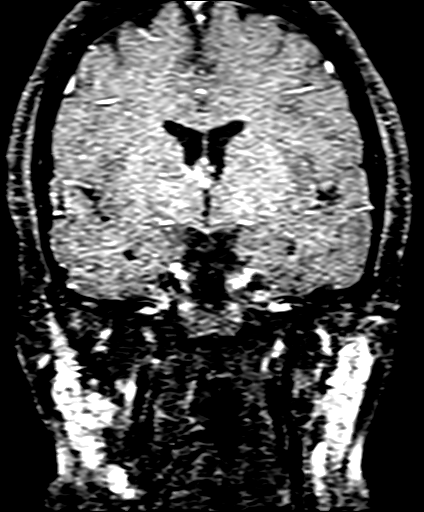

[Series 14: post fs cor · coronal · 3.0mm · 0.35mm/px · 1 of 7 slices shown (3 of 6)]
[im 1/7]
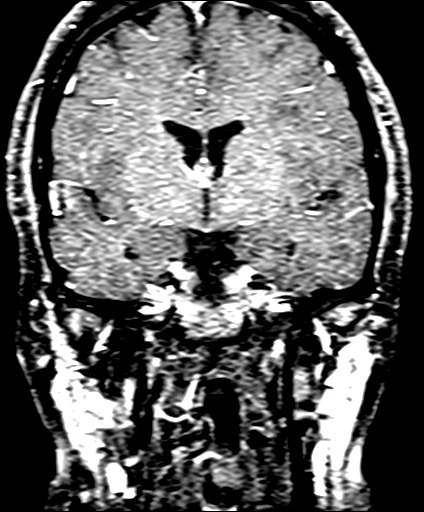

[Series 15: post fs cor · coronal · 3.0mm · 0.35mm/px · 1 of 7 slices shown (4 of 6)]
[im 1/7]
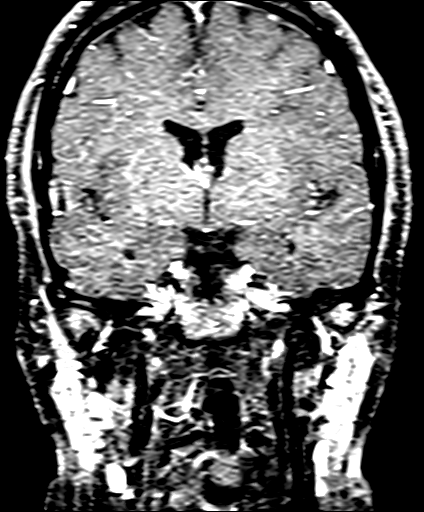

[Series 16: post fs cor · coronal · 3.0mm · 0.35mm/px · 1 of 7 slices shown (5 of 6)]
[im 1/7]
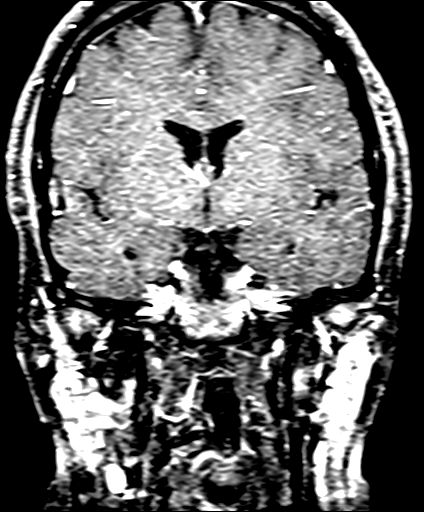

[Series 17: post fs cor · coronal · 3.0mm · 0.35mm/px · 1 of 7 slices shown (6 of 6)]
[im 1/7]
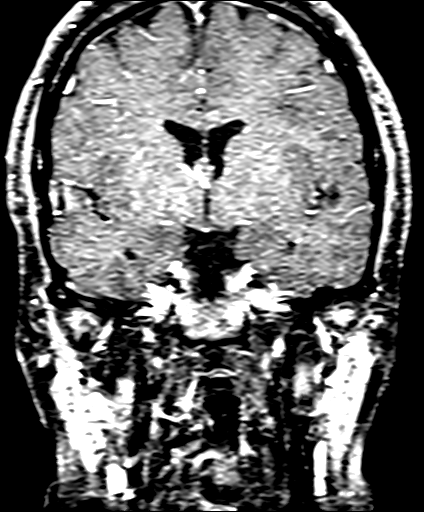

[Series 18: post sag 3mm · sagittal · 3.0mm · 0.33mm/px · 1 of 11 slices shown]
[im 1/11]
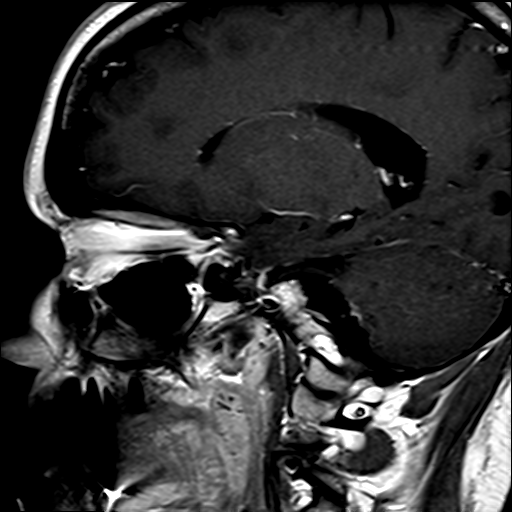

[34 of 48 positions shown; findings below may reference images not displayed]

FINDINGS: Brain: No acute infarction, hemorrhage, hydrocephalus or extra-axial
collection.

A 5 mm hypoenhancing lesion is seen on the left side of the
pituitary gland, only appreciated on the dynamic post-contrast
images (series 14, image 4) with delayed enhancement. This has not
significantly changed from prior MRI. The pituitary stalk is
centered and has normal thickness. The suprasellar cistern is
maintained. Cavernous sinuses are unremarkable.

Vascular: Normal flow voids.

Skull and upper cervical spine: Normal marrow signal.

Sinuses/Orbits: Negative.
IMPRESSION: Stable 5 mm hypoenhancing lesion on the left side of the pituitary
gland, which may represent a microadenoma.

## 2020-04-14 MED ORDER — GADOBENATE DIMEGLUMINE 529 MG/ML IV SOLN
10.0000 mL | Freq: Once | INTRAVENOUS | Status: AC | PRN
  Administered 2020-04-14: 10 mL via INTRAVENOUS

## 2020-04-22 DIAGNOSIS — F901 Attention-deficit hyperactivity disorder, predominantly hyperactive type: Secondary | ICD-10-CM | POA: Diagnosis not present

## 2020-04-22 DIAGNOSIS — Z630 Problems in relationship with spouse or partner: Secondary | ICD-10-CM | POA: Diagnosis not present

## 2020-04-30 DIAGNOSIS — Z630 Problems in relationship with spouse or partner: Secondary | ICD-10-CM | POA: Diagnosis not present

## 2020-04-30 DIAGNOSIS — F901 Attention-deficit hyperactivity disorder, predominantly hyperactive type: Secondary | ICD-10-CM | POA: Diagnosis not present

## 2020-05-06 DIAGNOSIS — F908 Attention-deficit hyperactivity disorder, other type: Secondary | ICD-10-CM | POA: Diagnosis not present

## 2020-05-06 DIAGNOSIS — F4322 Adjustment disorder with anxiety: Secondary | ICD-10-CM | POA: Diagnosis not present

## 2020-05-14 DIAGNOSIS — Z630 Problems in relationship with spouse or partner: Secondary | ICD-10-CM | POA: Diagnosis not present

## 2020-05-14 DIAGNOSIS — F908 Attention-deficit hyperactivity disorder, other type: Secondary | ICD-10-CM | POA: Diagnosis not present

## 2020-05-20 DIAGNOSIS — F908 Attention-deficit hyperactivity disorder, other type: Secondary | ICD-10-CM | POA: Diagnosis not present

## 2020-05-20 DIAGNOSIS — F4322 Adjustment disorder with anxiety: Secondary | ICD-10-CM | POA: Diagnosis not present

## 2020-05-27 DIAGNOSIS — F908 Attention-deficit hyperactivity disorder, other type: Secondary | ICD-10-CM | POA: Diagnosis not present

## 2020-05-28 DIAGNOSIS — F901 Attention-deficit hyperactivity disorder, predominantly hyperactive type: Secondary | ICD-10-CM | POA: Diagnosis not present

## 2020-05-28 DIAGNOSIS — Z630 Problems in relationship with spouse or partner: Secondary | ICD-10-CM | POA: Diagnosis not present

## 2020-06-03 DIAGNOSIS — F908 Attention-deficit hyperactivity disorder, other type: Secondary | ICD-10-CM | POA: Diagnosis not present

## 2020-06-03 DIAGNOSIS — F4322 Adjustment disorder with anxiety: Secondary | ICD-10-CM | POA: Diagnosis not present

## 2020-06-11 DIAGNOSIS — F901 Attention-deficit hyperactivity disorder, predominantly hyperactive type: Secondary | ICD-10-CM | POA: Diagnosis not present

## 2020-06-11 DIAGNOSIS — Z630 Problems in relationship with spouse or partner: Secondary | ICD-10-CM | POA: Diagnosis not present

## 2020-06-17 DIAGNOSIS — F4322 Adjustment disorder with anxiety: Secondary | ICD-10-CM | POA: Diagnosis not present

## 2020-06-17 DIAGNOSIS — F908 Attention-deficit hyperactivity disorder, other type: Secondary | ICD-10-CM | POA: Diagnosis not present

## 2020-06-25 DIAGNOSIS — F4322 Adjustment disorder with anxiety: Secondary | ICD-10-CM | POA: Diagnosis not present

## 2020-06-25 DIAGNOSIS — Z630 Problems in relationship with spouse or partner: Secondary | ICD-10-CM | POA: Diagnosis not present

## 2020-07-01 DIAGNOSIS — F4322 Adjustment disorder with anxiety: Secondary | ICD-10-CM | POA: Diagnosis not present

## 2020-07-01 DIAGNOSIS — F908 Attention-deficit hyperactivity disorder, other type: Secondary | ICD-10-CM | POA: Diagnosis not present

## 2020-07-09 DIAGNOSIS — Z630 Problems in relationship with spouse or partner: Secondary | ICD-10-CM | POA: Diagnosis not present

## 2020-07-09 DIAGNOSIS — F901 Attention-deficit hyperactivity disorder, predominantly hyperactive type: Secondary | ICD-10-CM | POA: Diagnosis not present

## 2020-07-11 DIAGNOSIS — F331 Major depressive disorder, recurrent, moderate: Secondary | ICD-10-CM | POA: Diagnosis not present

## 2020-07-15 DIAGNOSIS — F908 Attention-deficit hyperactivity disorder, other type: Secondary | ICD-10-CM | POA: Diagnosis not present

## 2020-07-15 DIAGNOSIS — Z630 Problems in relationship with spouse or partner: Secondary | ICD-10-CM | POA: Diagnosis not present

## 2020-07-23 DIAGNOSIS — Z630 Problems in relationship with spouse or partner: Secondary | ICD-10-CM | POA: Diagnosis not present

## 2020-07-23 DIAGNOSIS — F901 Attention-deficit hyperactivity disorder, predominantly hyperactive type: Secondary | ICD-10-CM | POA: Diagnosis not present

## 2020-07-30 DIAGNOSIS — F908 Attention-deficit hyperactivity disorder, other type: Secondary | ICD-10-CM | POA: Diagnosis not present

## 2020-07-30 DIAGNOSIS — F4322 Adjustment disorder with anxiety: Secondary | ICD-10-CM | POA: Diagnosis not present

## 2020-08-06 DIAGNOSIS — Z630 Problems in relationship with spouse or partner: Secondary | ICD-10-CM | POA: Diagnosis not present

## 2020-08-06 DIAGNOSIS — F909 Attention-deficit hyperactivity disorder, unspecified type: Secondary | ICD-10-CM | POA: Diagnosis not present

## 2020-08-12 DIAGNOSIS — F4322 Adjustment disorder with anxiety: Secondary | ICD-10-CM | POA: Diagnosis not present

## 2020-08-12 DIAGNOSIS — F908 Attention-deficit hyperactivity disorder, other type: Secondary | ICD-10-CM | POA: Diagnosis not present

## 2020-08-13 DIAGNOSIS — F411 Generalized anxiety disorder: Secondary | ICD-10-CM | POA: Diagnosis not present

## 2020-08-20 DIAGNOSIS — Z630 Problems in relationship with spouse or partner: Secondary | ICD-10-CM | POA: Diagnosis not present

## 2020-08-20 DIAGNOSIS — F908 Attention-deficit hyperactivity disorder, other type: Secondary | ICD-10-CM | POA: Diagnosis not present

## 2020-08-26 DIAGNOSIS — F908 Attention-deficit hyperactivity disorder, other type: Secondary | ICD-10-CM | POA: Diagnosis not present

## 2020-09-03 DIAGNOSIS — Z630 Problems in relationship with spouse or partner: Secondary | ICD-10-CM | POA: Diagnosis not present

## 2020-09-03 DIAGNOSIS — F908 Attention-deficit hyperactivity disorder, other type: Secondary | ICD-10-CM | POA: Diagnosis not present

## 2020-09-09 DIAGNOSIS — F4322 Adjustment disorder with anxiety: Secondary | ICD-10-CM | POA: Diagnosis not present

## 2020-09-09 DIAGNOSIS — F908 Attention-deficit hyperactivity disorder, other type: Secondary | ICD-10-CM | POA: Diagnosis not present

## 2020-09-15 DIAGNOSIS — Z01419 Encounter for gynecological examination (general) (routine) without abnormal findings: Secondary | ICD-10-CM | POA: Diagnosis not present

## 2020-09-15 DIAGNOSIS — R102 Pelvic and perineal pain: Secondary | ICD-10-CM | POA: Diagnosis not present

## 2020-09-15 DIAGNOSIS — Z6828 Body mass index (BMI) 28.0-28.9, adult: Secondary | ICD-10-CM | POA: Diagnosis not present

## 2020-09-15 DIAGNOSIS — F4322 Adjustment disorder with anxiety: Secondary | ICD-10-CM | POA: Diagnosis not present

## 2020-09-15 DIAGNOSIS — Z1231 Encounter for screening mammogram for malignant neoplasm of breast: Secondary | ICD-10-CM | POA: Diagnosis not present

## 2020-09-15 DIAGNOSIS — Z114 Encounter for screening for human immunodeficiency virus [HIV]: Secondary | ICD-10-CM | POA: Diagnosis not present

## 2020-09-15 DIAGNOSIS — Z1159 Encounter for screening for other viral diseases: Secondary | ICD-10-CM | POA: Diagnosis not present

## 2020-09-15 DIAGNOSIS — Z118 Encounter for screening for other infectious and parasitic diseases: Secondary | ICD-10-CM | POA: Diagnosis not present

## 2020-09-15 DIAGNOSIS — Z124 Encounter for screening for malignant neoplasm of cervix: Secondary | ICD-10-CM | POA: Diagnosis not present

## 2020-09-15 DIAGNOSIS — Z113 Encounter for screening for infections with a predominantly sexual mode of transmission: Secondary | ICD-10-CM | POA: Diagnosis not present

## 2020-09-15 DIAGNOSIS — F908 Attention-deficit hyperactivity disorder, other type: Secondary | ICD-10-CM | POA: Diagnosis not present

## 2020-09-15 DIAGNOSIS — N921 Excessive and frequent menstruation with irregular cycle: Secondary | ICD-10-CM | POA: Diagnosis not present

## 2020-09-16 DIAGNOSIS — F908 Attention-deficit hyperactivity disorder, other type: Secondary | ICD-10-CM | POA: Diagnosis not present

## 2020-09-23 DIAGNOSIS — F4322 Adjustment disorder with anxiety: Secondary | ICD-10-CM | POA: Diagnosis not present

## 2020-09-23 DIAGNOSIS — F908 Attention-deficit hyperactivity disorder, other type: Secondary | ICD-10-CM | POA: Diagnosis not present

## 2020-09-29 DIAGNOSIS — Z630 Problems in relationship with spouse or partner: Secondary | ICD-10-CM | POA: Diagnosis not present

## 2020-09-29 DIAGNOSIS — F908 Attention-deficit hyperactivity disorder, other type: Secondary | ICD-10-CM | POA: Diagnosis not present

## 2020-10-06 DIAGNOSIS — B349 Viral infection, unspecified: Secondary | ICD-10-CM | POA: Diagnosis not present

## 2020-10-07 DIAGNOSIS — F901 Attention-deficit hyperactivity disorder, predominantly hyperactive type: Secondary | ICD-10-CM | POA: Diagnosis not present

## 2020-10-07 DIAGNOSIS — Z630 Problems in relationship with spouse or partner: Secondary | ICD-10-CM | POA: Diagnosis not present

## 2020-10-13 DIAGNOSIS — Z630 Problems in relationship with spouse or partner: Secondary | ICD-10-CM | POA: Diagnosis not present

## 2020-10-13 DIAGNOSIS — F908 Attention-deficit hyperactivity disorder, other type: Secondary | ICD-10-CM | POA: Diagnosis not present

## 2020-10-14 DIAGNOSIS — F908 Attention-deficit hyperactivity disorder, other type: Secondary | ICD-10-CM | POA: Diagnosis not present

## 2020-10-14 DIAGNOSIS — Z630 Problems in relationship with spouse or partner: Secondary | ICD-10-CM | POA: Diagnosis not present

## 2020-10-21 DIAGNOSIS — F410 Panic disorder [episodic paroxysmal anxiety] without agoraphobia: Secondary | ICD-10-CM | POA: Diagnosis not present

## 2020-10-21 DIAGNOSIS — R102 Pelvic and perineal pain: Secondary | ICD-10-CM | POA: Diagnosis not present

## 2020-10-27 DIAGNOSIS — F908 Attention-deficit hyperactivity disorder, other type: Secondary | ICD-10-CM | POA: Diagnosis not present

## 2020-10-27 DIAGNOSIS — Z630 Problems in relationship with spouse or partner: Secondary | ICD-10-CM | POA: Diagnosis not present

## 2020-10-28 DIAGNOSIS — Z630 Problems in relationship with spouse or partner: Secondary | ICD-10-CM | POA: Diagnosis not present

## 2020-10-28 DIAGNOSIS — F908 Attention-deficit hyperactivity disorder, other type: Secondary | ICD-10-CM | POA: Diagnosis not present

## 2020-11-10 DIAGNOSIS — F908 Attention-deficit hyperactivity disorder, other type: Secondary | ICD-10-CM | POA: Diagnosis not present

## 2020-11-10 DIAGNOSIS — F4322 Adjustment disorder with anxiety: Secondary | ICD-10-CM | POA: Diagnosis not present

## 2020-11-18 DIAGNOSIS — F4322 Adjustment disorder with anxiety: Secondary | ICD-10-CM | POA: Diagnosis not present

## 2020-11-18 DIAGNOSIS — F908 Attention-deficit hyperactivity disorder, other type: Secondary | ICD-10-CM | POA: Diagnosis not present

## 2020-11-24 ENCOUNTER — Emergency Department (HOSPITAL_COMMUNITY): Payer: BC Managed Care – PPO

## 2020-11-24 ENCOUNTER — Emergency Department (HOSPITAL_COMMUNITY)
Admission: EM | Admit: 2020-11-24 | Discharge: 2020-11-25 | Disposition: A | Discharge status: Home / Self Care | Payer: BC Managed Care – PPO | Attending: Emergency Medicine | Admitting: Emergency Medicine

## 2020-11-24 DIAGNOSIS — S99919A Unspecified injury of unspecified ankle, initial encounter: Secondary | ICD-10-CM

## 2020-11-24 DIAGNOSIS — I10 Essential (primary) hypertension: Secondary | ICD-10-CM | POA: Diagnosis not present

## 2020-11-24 DIAGNOSIS — S8252XA Displaced fracture of medial malleolus of left tibia, initial encounter for closed fracture: Secondary | ICD-10-CM | POA: Insufficient documentation

## 2020-11-24 DIAGNOSIS — S99912A Unspecified injury of left ankle, initial encounter: Secondary | ICD-10-CM | POA: Diagnosis not present

## 2020-11-24 DIAGNOSIS — S8262XA Displaced fracture of lateral malleolus of left fibula, initial encounter for closed fracture: Secondary | ICD-10-CM | POA: Insufficient documentation

## 2020-11-24 DIAGNOSIS — S82832A Other fracture of upper and lower end of left fibula, initial encounter for closed fracture: Secondary | ICD-10-CM | POA: Diagnosis not present

## 2020-11-24 DIAGNOSIS — W010XXA Fall on same level from slipping, tripping and stumbling without subsequent striking against object, initial encounter: Secondary | ICD-10-CM | POA: Diagnosis not present

## 2020-11-24 DIAGNOSIS — Y9363 Activity, rugby: Secondary | ICD-10-CM | POA: Diagnosis not present

## 2020-11-24 DIAGNOSIS — R000 Tachycardia, unspecified: Secondary | ICD-10-CM | POA: Diagnosis not present

## 2020-11-24 DIAGNOSIS — M7989 Other specified soft tissue disorders: Secondary | ICD-10-CM | POA: Diagnosis not present

## 2020-11-24 DIAGNOSIS — S82892A Other fracture of left lower leg, initial encounter for closed fracture: Secondary | ICD-10-CM

## 2020-11-24 DIAGNOSIS — S82302A Unspecified fracture of lower end of left tibia, initial encounter for closed fracture: Secondary | ICD-10-CM | POA: Diagnosis not present

## 2020-11-24 IMAGING — DX DG TIBIA/FIBULA 2V*L*
2 series · 4 of 4 positions shown · non-contrast
Comparison: None.

CLINICAL DATA: Left ankle injury after fall playing rugby.

EXAM:
LEFT TIBIA AND FIBULA - 2 VIEW

[Series 1: tibia ap · 0.14mm/px · 2 of 2 slices shown]
[im 1/2]
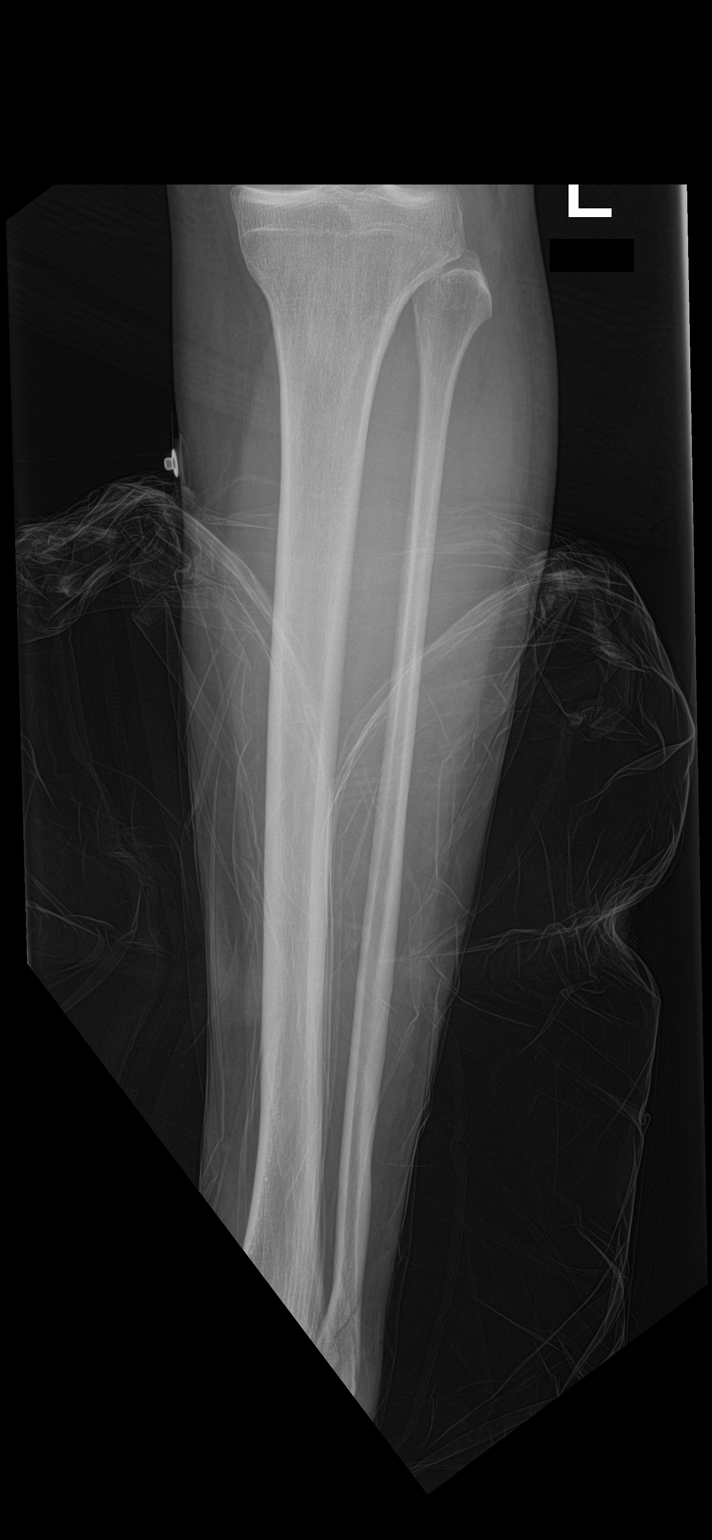
[im 2/2]
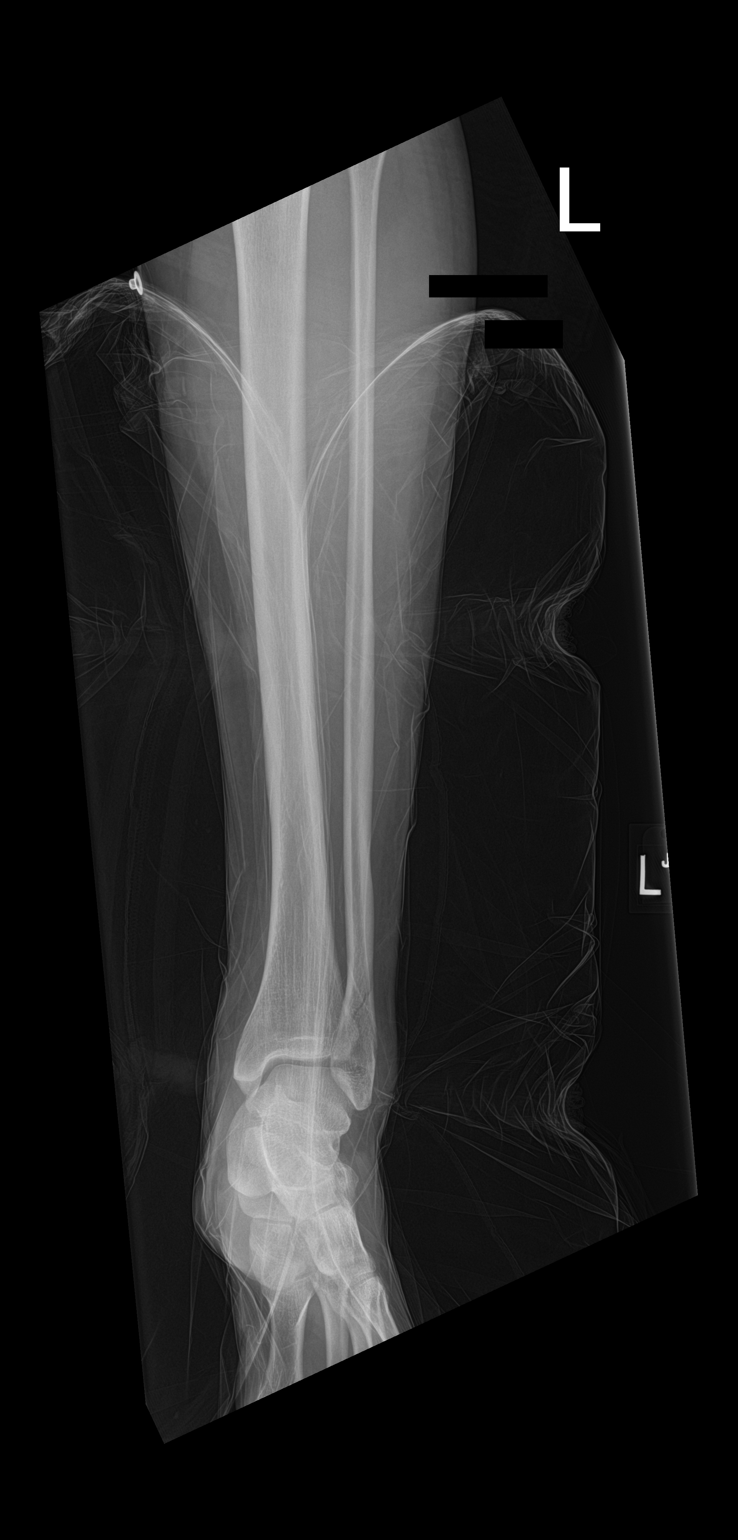

[Series 2: tibia lat · 0.14mm/px · 2 of 2 slices shown]
[im 1/2]
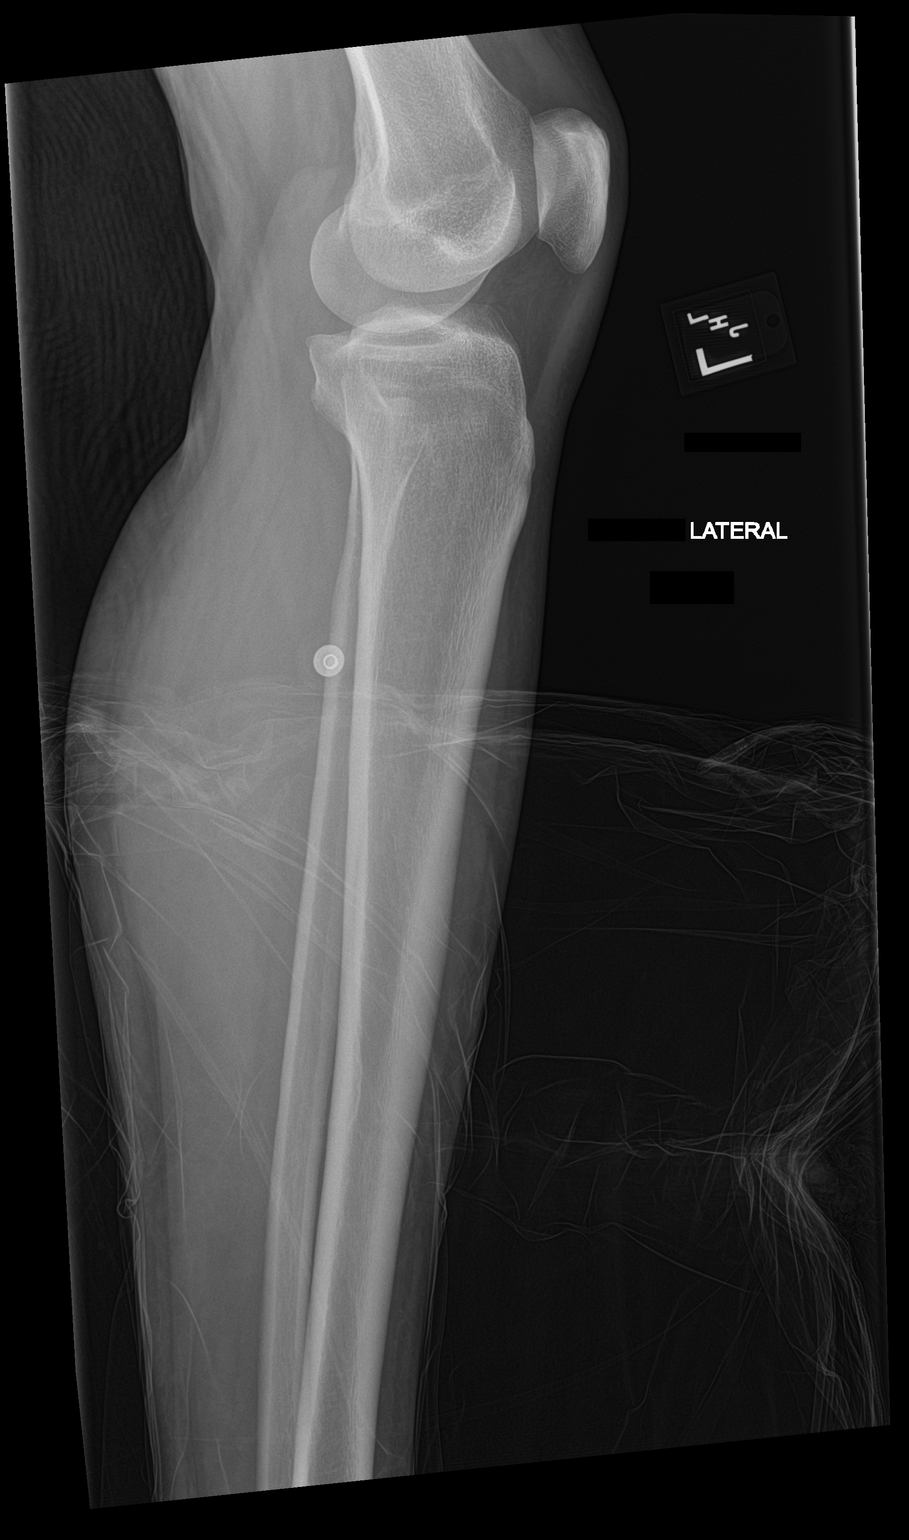
[im 2/2]
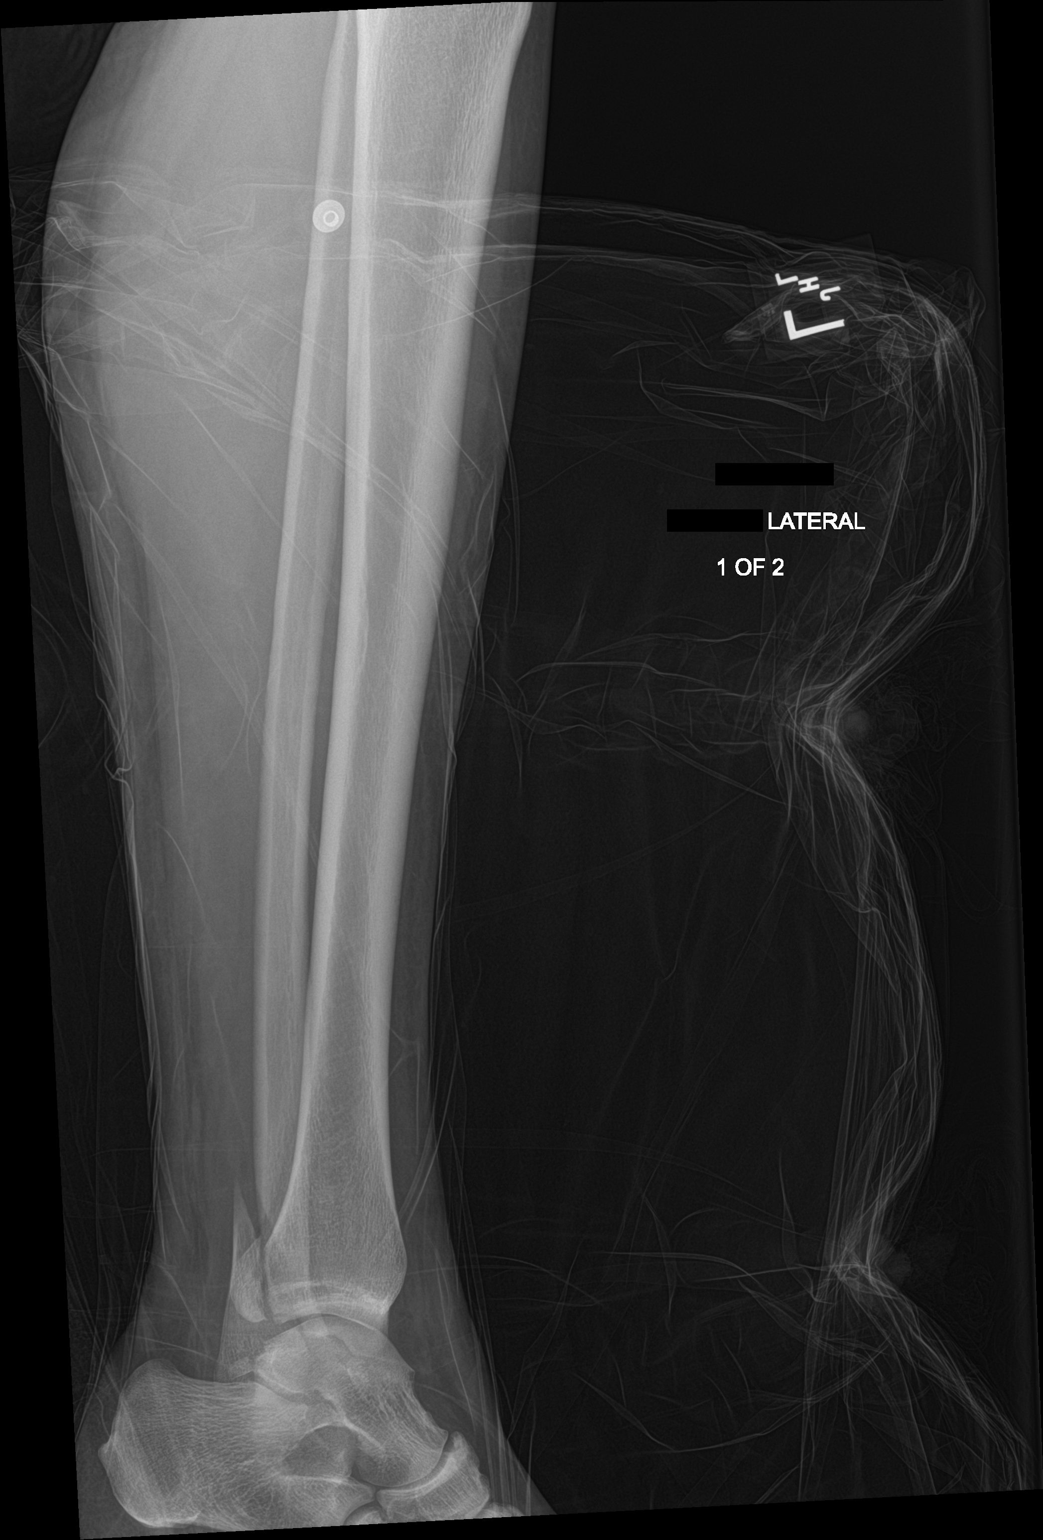

[4 of 4 positions shown; findings below may reference images not displayed]

FINDINGS: Moderately displaced distal left fibular fracture is noted. Mildly
displaced posterior malleolar fracture of distal tibia is noted.
IMPRESSION: Distal left fibular and tibial fractures as described above.

## 2020-11-24 IMAGING — DX DG ANKLE PORT 2V*L*
3 series · 3 of 3 positions shown · non-contrast
Comparison: None.

CLINICAL DATA: Left ankle injury after fall.

EXAM:
PORTABLE LEFT ANKLE - 2 VIEW

[ankle ap]
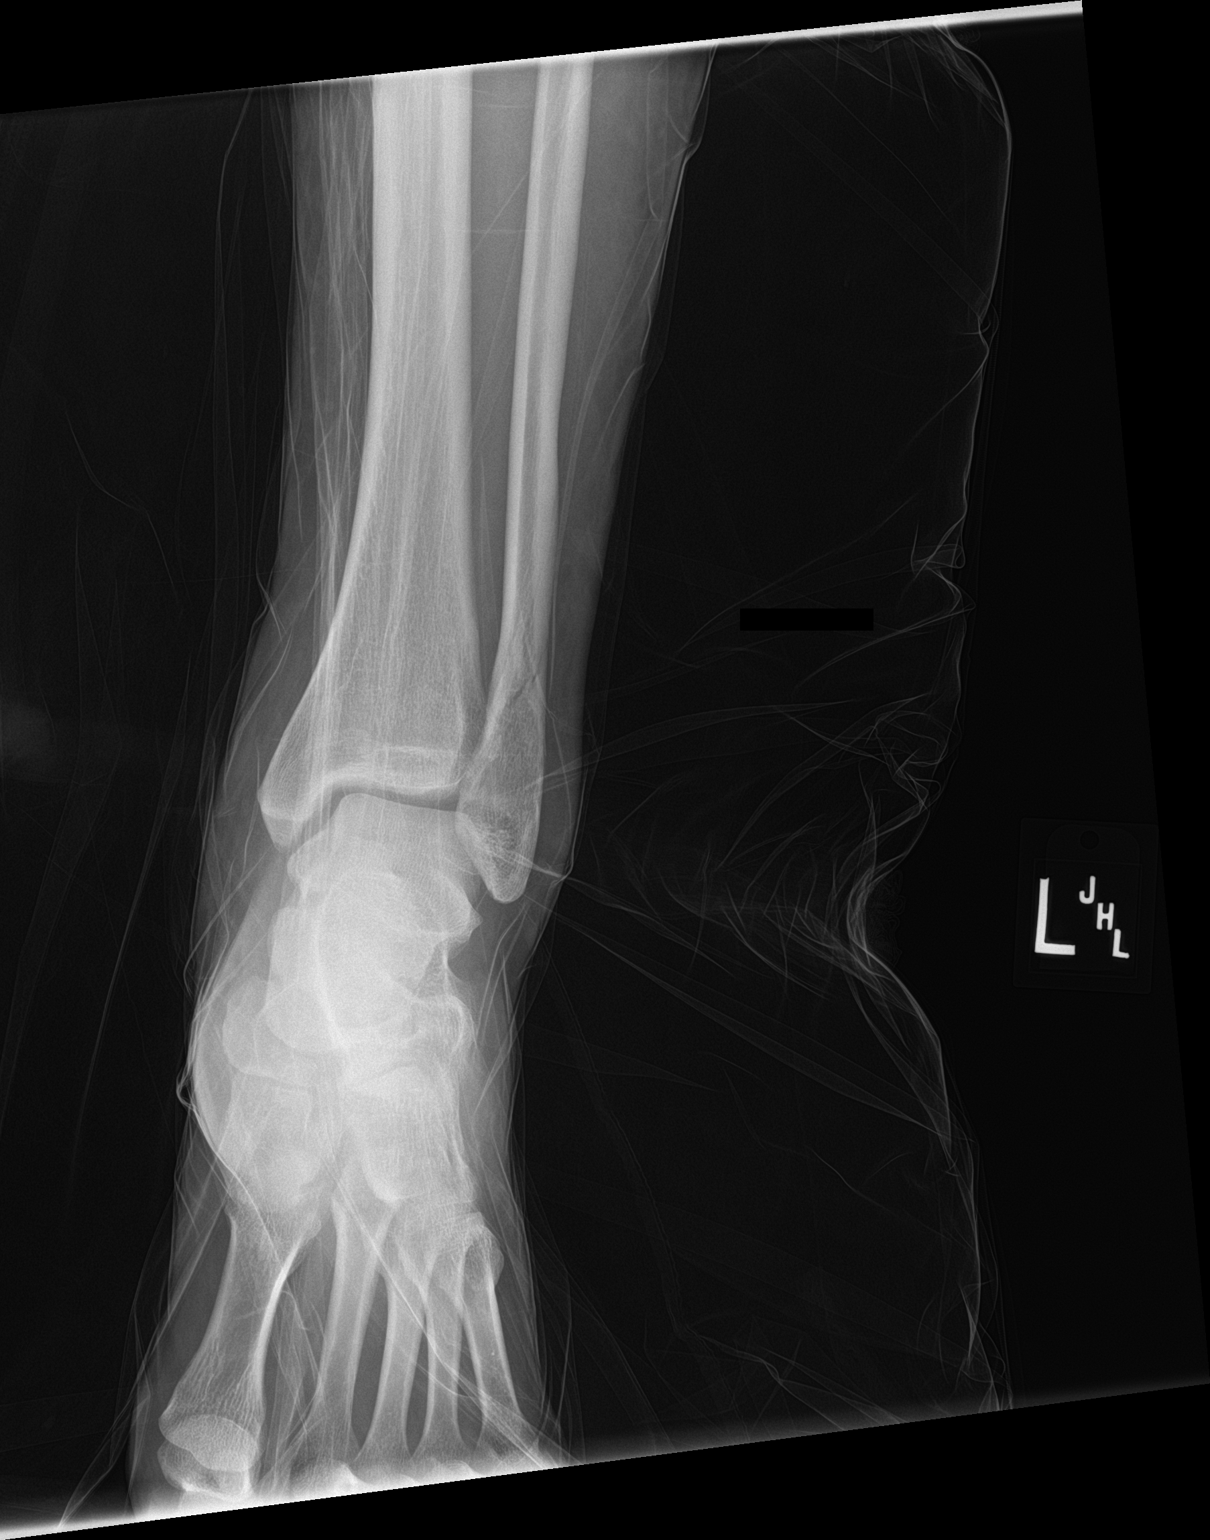

[ankle lat]
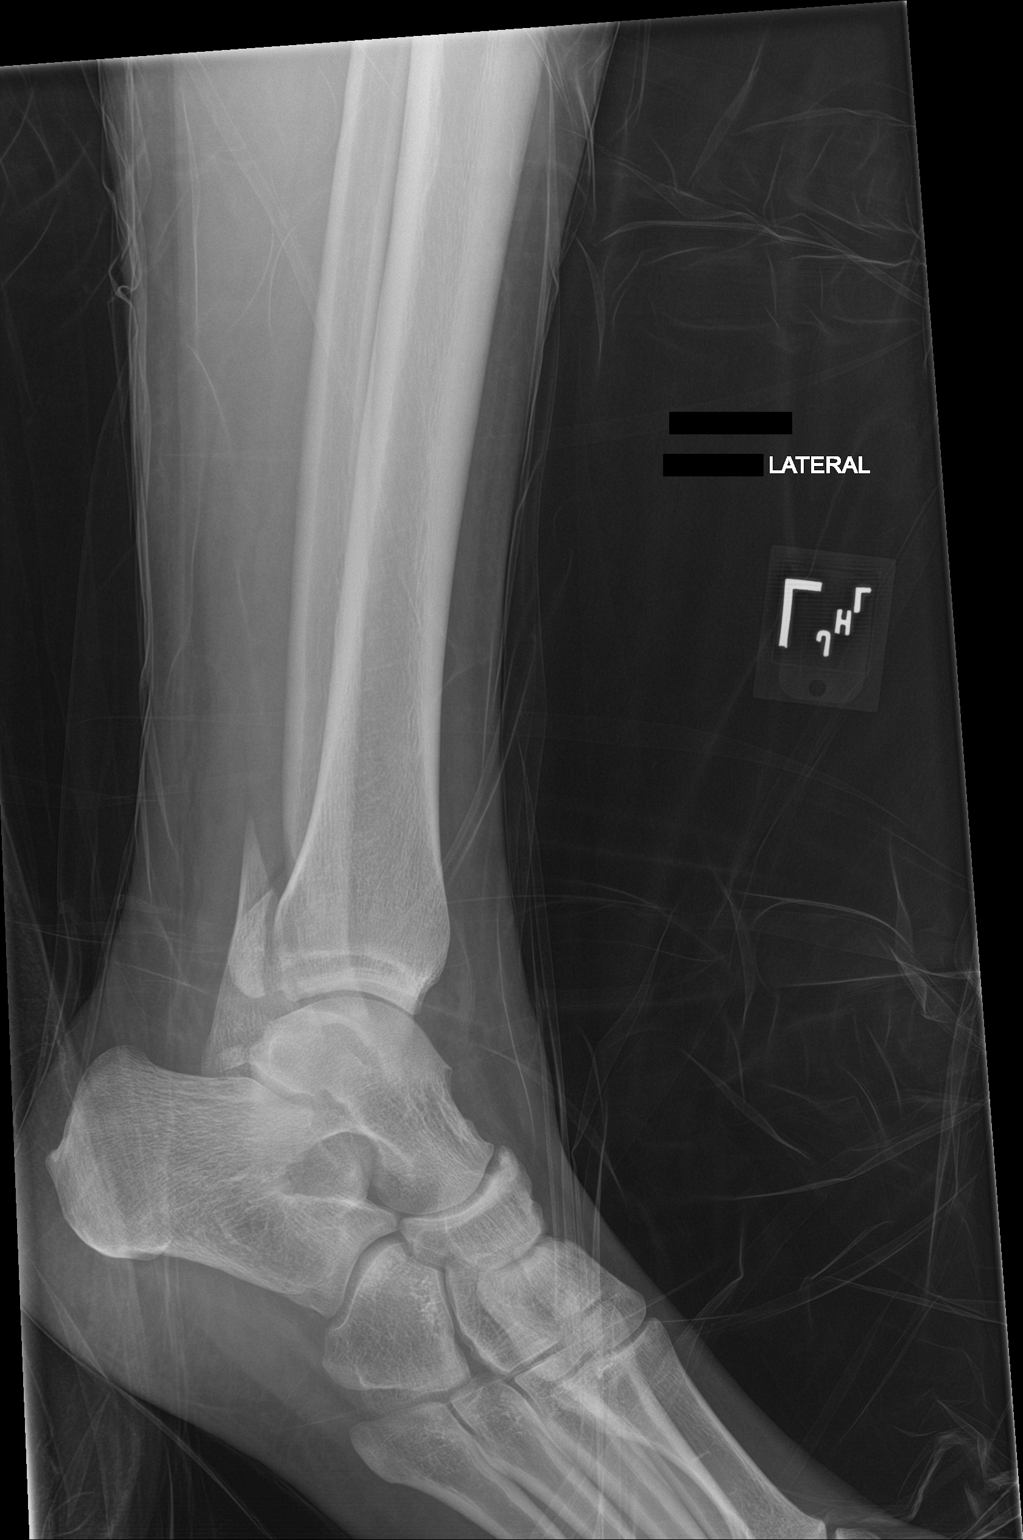

[ankle obl]
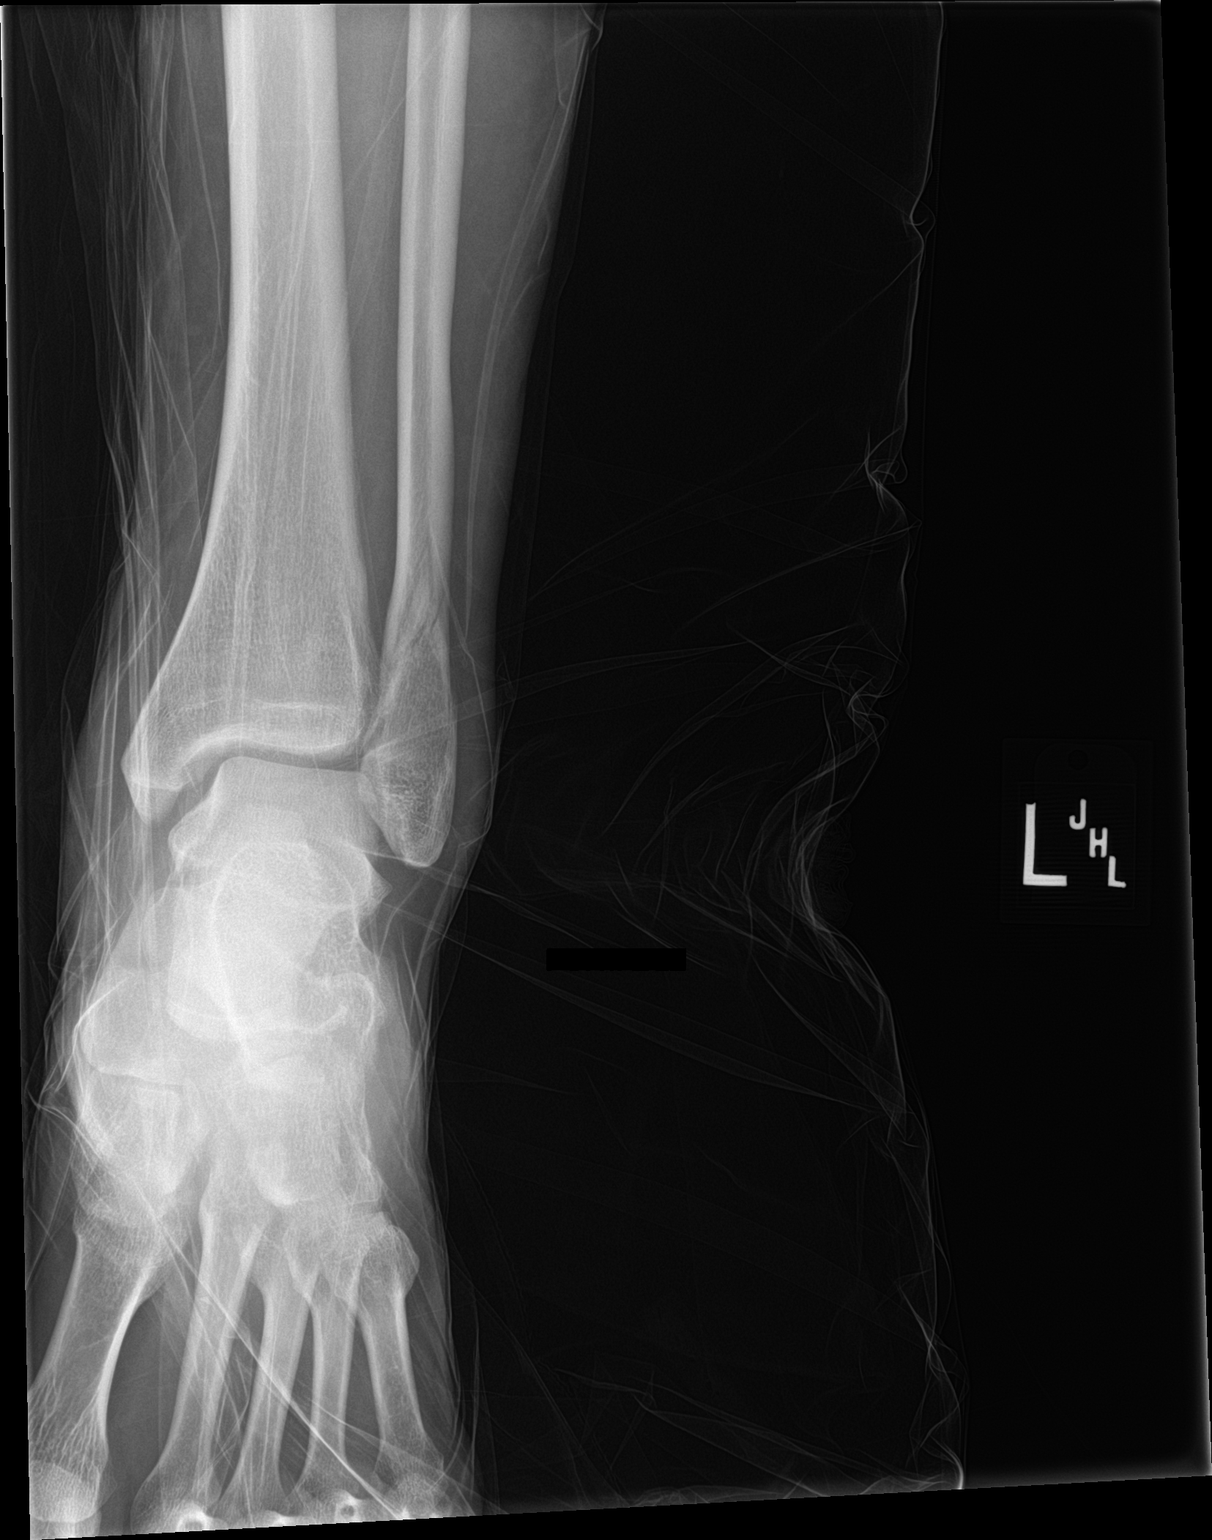

[3 of 3 positions shown; findings below may reference images not displayed]

FINDINGS: Moderately displaced oblique fracture of distal left fibula is
noted. Mildly displaced posterior malleolar fracture of distal tibia
is noted. Mild lateral dislocation of talus relative to distal tibia
is noted.
IMPRESSION: Moderately displaced distal left fibular fracture. Mildly displaced
posterior malleolar fracture of distal tibia. Mild lateral
dislocation of talus relative to distal tibia.

## 2020-11-24 IMAGING — DX DG ANKLE 2V *L*
2 series · 2 of 2 positions shown · non-contrast
Comparison: [DATE]

CLINICAL DATA: Post reduction images.

EXAM:
LEFT ANKLE - 2 VIEW

[ankle ap]
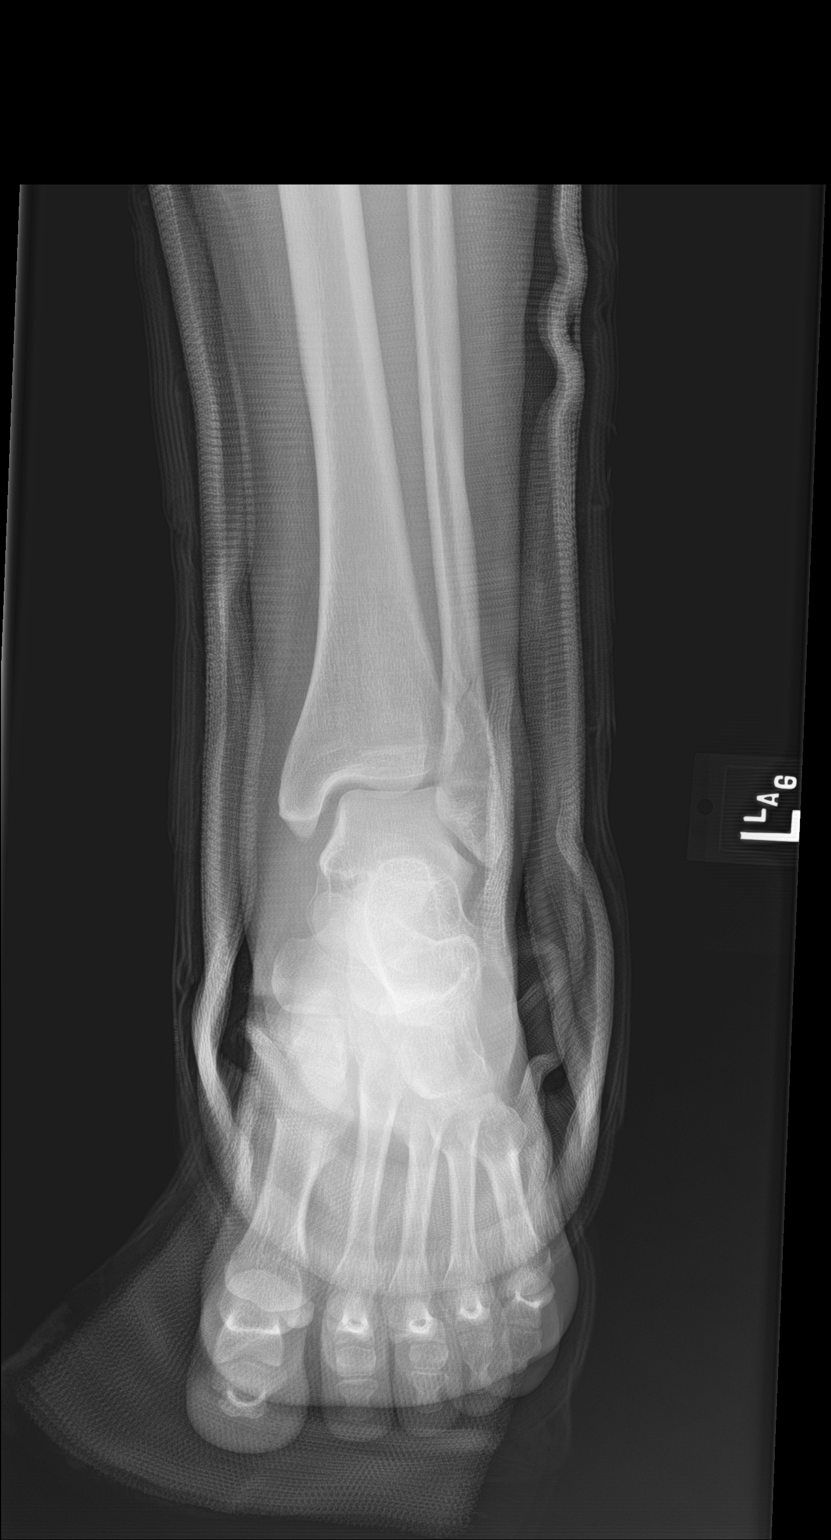

[ankle lat]
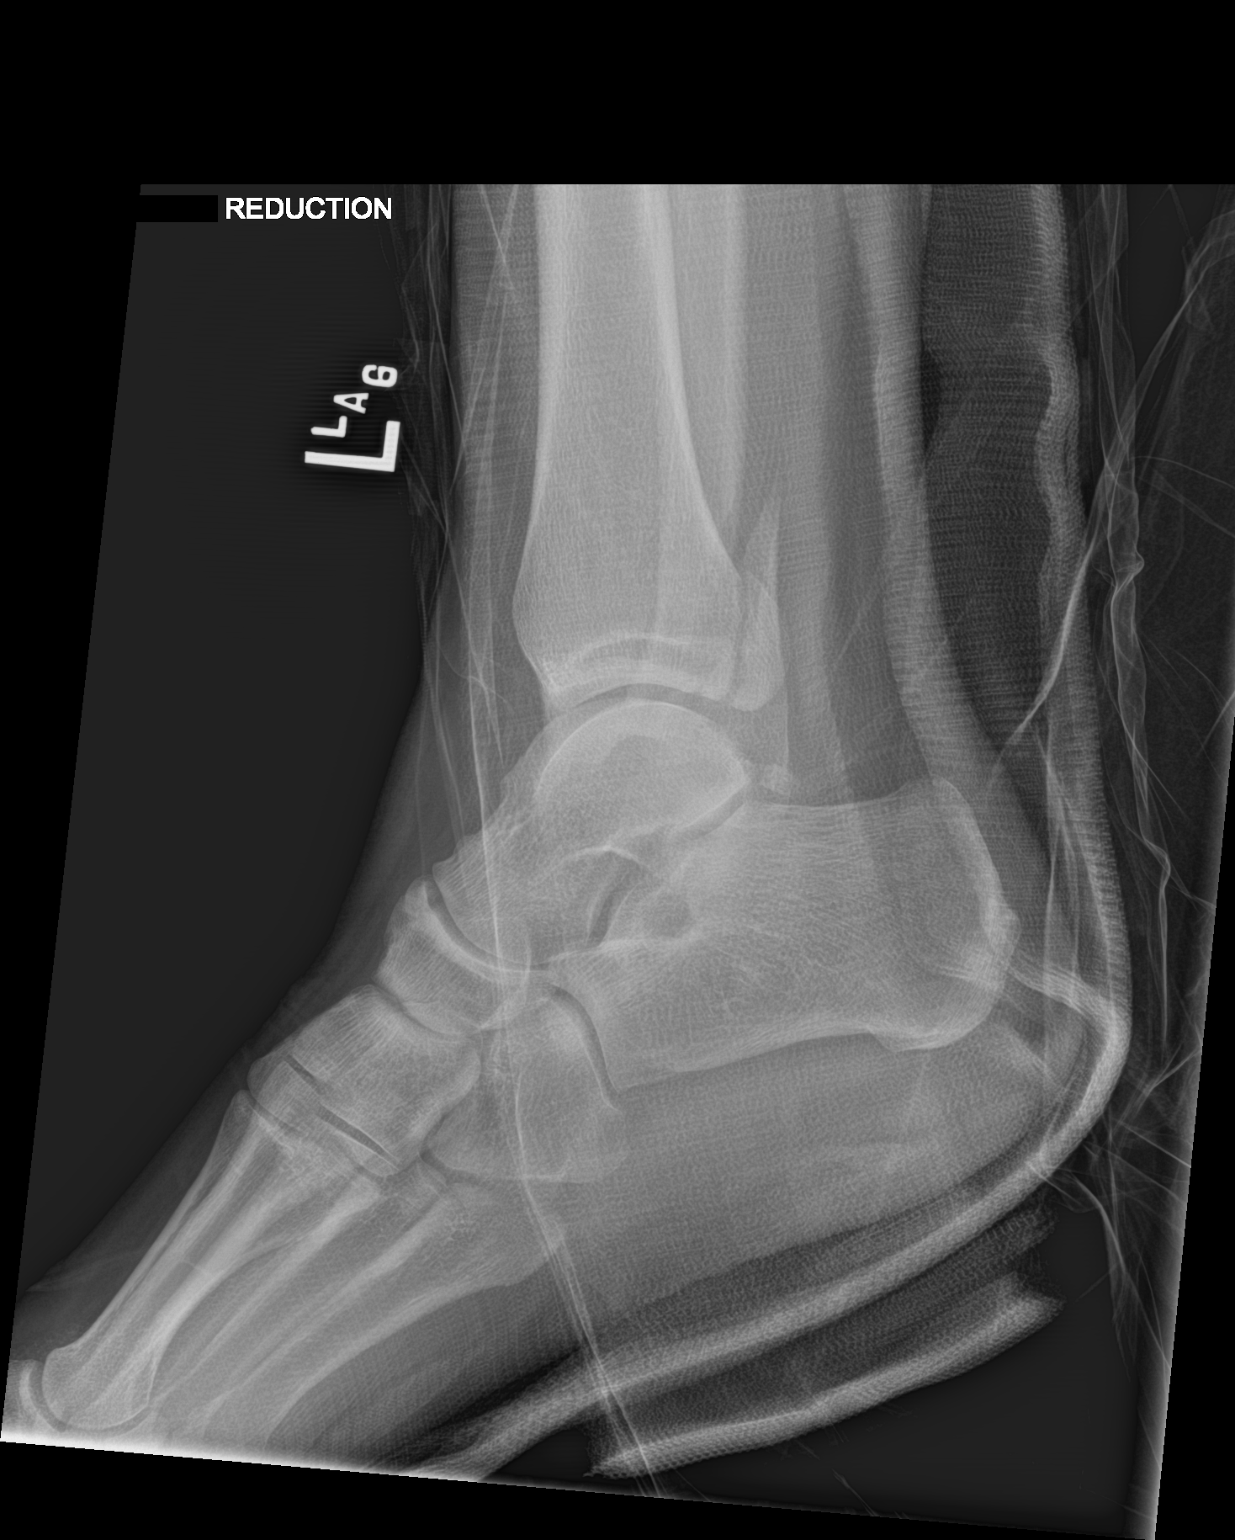

[2 of 2 positions shown; findings below may reference images not displayed]

FINDINGS: The left ankle was imaged in a fiberglass cast with subsequently
obscured osseous detail. Acute, mildly displaced fractures of the
left posterior malleolus and distal left fibula are seen. There is
no evidence of dislocation. Mild soft tissue swelling is noted.
IMPRESSION: Status post reduction of acute fractures of the left posterior
malleolus and distal left fibula.

## 2020-11-24 MED ORDER — HYDROCODONE-ACETAMINOPHEN 5-325 MG PO TABS
1.0000 | ORAL_TABLET | Freq: Once | ORAL | Status: AC
Start: 2020-11-25 — End: 2020-11-24
  Administered 2020-11-24: 1 via ORAL
  Filled 2020-11-24: qty 1

## 2020-11-24 MED ORDER — ONDANSETRON 4 MG PO TBDP
4.0000 mg | ORAL_TABLET | Freq: Three times a day (TID) | ORAL | 0 refills | Status: DC | PRN
Start: 2020-11-24 — End: 2020-11-24

## 2020-11-24 MED ORDER — HYDROMORPHONE HCL 1 MG/ML IJ SOLN
0.5000 mg | Freq: Once | INTRAMUSCULAR | Status: AC
  Administered 2020-11-24: 0.5 mg via INTRAVENOUS
  Filled 2020-11-24: qty 1

## 2020-11-24 MED ORDER — ONDANSETRON HCL 4 MG/2ML IJ SOLN
4.0000 mg | Freq: Once | INTRAMUSCULAR | Status: AC
  Administered 2020-11-24: 4 mg via INTRAVENOUS
  Filled 2020-11-24: qty 2

## 2020-11-24 MED ORDER — ONDANSETRON 4 MG PO TBDP
4.0000 mg | ORAL_TABLET | Freq: Three times a day (TID) | ORAL | 0 refills | Status: DC | PRN
Start: 2020-11-24 — End: 2020-11-27

## 2020-11-24 MED ORDER — HYDROCODONE-ACETAMINOPHEN 5-325 MG PO TABS: 1.0000 | ORAL_TABLET | Freq: Four times a day (QID) | ORAL | 0 refills | Status: DC | PRN

## 2020-11-24 NOTE — ED Triage Notes (Signed)
Presents with left ankle injury with deformity. Pt was playing rugby, fell, and heard a "pop." Denies head/neck/back pain and LOC.

## 2020-11-24 NOTE — ED Notes (Signed)
Call placed and delivered for ortho to apply a splint on patient

## 2020-11-25 ENCOUNTER — Encounter: Payer: Self-pay | Admitting: Orthopaedic Surgery

## 2020-11-25 ENCOUNTER — Ambulatory Visit: Payer: BC Managed Care – PPO | Admitting: Surgery

## 2020-11-25 ENCOUNTER — Encounter: Payer: Self-pay | Admitting: Surgery

## 2020-11-25 VITALS — Ht 67.0 in | Wt 164.0 lb

## 2020-11-25 DIAGNOSIS — S82842A Displaced bimalleolar fracture of left lower leg, initial encounter for closed fracture: Secondary | ICD-10-CM | POA: Diagnosis not present

## 2020-11-25 NOTE — Progress Notes (Signed)
Orthopedic Tech Progress Note Patient Details:  Claudia Rodriguez November 14, 1990 [id number]  Ortho Devices Type of Ortho Device: Post (short leg) splint, Stirrup splint Ortho Device/Splint Location: lle Ortho Device/Splint Interventions: Ordered, Application, Adjustment   Post Interventions Patient Tolerated: Well Instructions Provided: Care of device, Adjustment of device  Karolee Stamps 11/25/2020, 1:24 AM

## 2020-11-25 NOTE — ED Provider Notes (Signed)
[facility] DEPT Provider Note   CSN: [id number] Arrival date & time: 11/24/20  2031     History Chief Complaint  Patient presents with   Ankle Injury    Claudia Rodriguez is a 30 y.o. female.   Ankle Injury Pertinent negatives include no abdominal pain and no shortness of breath. Patient presents with left ankle injury.Twisted and fell while playing rugby.  Felt a pop.  Reportedly had deformity.  No other injury.  Otherwise healthy.  Skin intact.     Past Medical History:  Diagnosis Date   Postpartum care following vaginal delivery (8/1) 10/28/2015    Patient Active Problem List   Diagnosis Date Noted   Postpartum care following vaginal delivery (8/1) 10/28/2015   Pregnancy 10/27/2015    Past Surgical History:  Procedure Laterality Date   INDUCED ABORTION     twice     OB History     Gravida  3   Para  1   Term  1   Preterm      AB  2   Living  1      SAB      IAB      Ectopic      Multiple  0   Live Births  1           No family history on file.  Social History   Tobacco Use   Smoking status: Never   Smokeless tobacco: Never  Substance Use Topics   Alcohol use: No   Drug use: No    Home Medications Prior to Admission medications   Medication Sig Start Date End Date Taking? Authorizing Provider  HYDROcodone-acetaminophen (NORCO/VICODIN) 5-325 MG tablet Take 1-2 tablets by mouth every 6 (six) hours as needed. 11/24/20  Yes Davonna Belling, MD  acetaminophen (TYLENOL) 325 MG tablet Take 2 tablets (650 mg total) by mouth every 4 (four) hours as needed (for pain scale < 4). 10/29/15   Juliene Pina, CNM  coconut oil OIL Apply 1 application topically as needed. 10/29/15   Juliene Pina, CNM  ibuprofen (ADVIL,MOTRIN) 600 MG tablet Take 1 tablet (600 mg total) by mouth every 6 (six) hours. 10/29/15   Juliene Pina, CNM  ondansetron (ZOFRAN-ODT) 4 MG disintegrating tablet Take 1 tablet (4 mg total) by mouth  every 8 (eight) hours as needed for nausea or vomiting. 11/24/20   Davonna Belling, MD  Prenatal Vit-Fe Fumarate-FA (PRENATAL MULTIVITAMIN) TABS tablet Take 1 tablet by mouth at bedtime.    [provider]  sertraline (ZOLOFT) 50 MG tablet Take 50 mg by mouth at bedtime.    [provider]    Allergies    Amoxicillin and Penicillins  Review of Systems   Review of Systems  Constitutional:  Negative for appetite change.  Respiratory:  Negative for shortness of breath.   Gastrointestinal:  Negative for abdominal pain.  Musculoskeletal:  Negative for back pain.       Left ankle injury.  Skin:  Negative for wound.  Neurological:  Negative for weakness.   Physical Exam Updated Vital Signs BP (!) 127/91   Pulse 78   Temp 98 F (36.7 C) (Oral)   Resp 16   SpO2 98%   Physical Exam Vitals and nursing note reviewed.  HENT:     Head: Atraumatic.  Cardiovascular:     Rate and Rhythm: Regular rhythm.  Chest:     Chest wall: No tenderness.  Abdominal:  Tenderness: There is no abdominal tenderness.  Musculoskeletal:        General: Tenderness present.     Cervical back: Neck supple.     Comments: No tenderness over left knee or hip.  Tenderness over left ankle particularly laterally.  Some ecchymosis over a top of foot near ankle.  Neurovascular intact in toes.  Pulse intact.  Skin:    Capillary Refill: Capillary refill takes less than 2 seconds.  Neurological:     Mental Status: She is alert and oriented to person, place, and time.    ED Results / Procedures / Treatments   Labs (all labs ordered are listed, but only abnormal results are displayed) Labs Reviewed - No data to display  EKG None  Radiology DG Tibia/Fibula Left  Result Date: 11/24/2020 CLINICAL DATA:  Left ankle injury after fall playing rugby. EXAM: LEFT TIBIA AND FIBULA - 2 VIEW COMPARISON:  None. FINDINGS: Moderately displaced distal left fibular fracture is noted. Mildly displaced  posterior malleolar fracture of distal tibia is noted. IMPRESSION: Distal left fibular and tibial fractures as described above. Electronically Signed   By: Marijo Conception M.D.   On: 11/24/2020 21:34   DG Ankle 2 Views Left  Result Date: 11/24/2020 CLINICAL DATA:  Post reduction images. EXAM: LEFT ANKLE - 2 VIEW COMPARISON:  November 24, 2020 FINDINGS: The left ankle was imaged in a fiberglass cast with subsequently obscured osseous detail. Acute, mildly displaced fractures of the left posterior malleolus and distal left fibula are seen. There is no evidence of dislocation. Mild soft tissue swelling is noted. IMPRESSION: Status post reduction of acute fractures of the left posterior malleolus and distal left fibula. Electronically Signed   By: Virgina Norfolk M.D.   On: 11/24/2020 23:35   DG Ankle Left Port  Result Date: 11/24/2020 CLINICAL DATA:  Left ankle injury after fall. EXAM: PORTABLE LEFT ANKLE - 2 VIEW COMPARISON:  None. FINDINGS: Moderately displaced oblique fracture of distal left fibula is noted. Mildly displaced posterior malleolar fracture of distal tibia is noted. Mild lateral dislocation of talus relative to distal tibia is noted. IMPRESSION: Moderately displaced distal left fibular fracture. Mildly displaced posterior malleolar fracture of distal tibia. Mild lateral dislocation of talus relative to distal tibia. Electronically Signed   By: Marijo Conception M.D.   On: 11/24/2020 21:31    Procedures Procedures   Medications Ordered in ED Medications  ondansetron [facility]) injection 4 mg (4 mg Intravenous Given 11/24/20 2214)  HYDROmorphone (DILAUDID) injection 0.5 mg (0.5 mg Intravenous Given 11/24/20 2214)  HYDROcodone-acetaminophen (NORCO/VICODIN) 5-325 MG per tablet 1 tablet (1 tablet Oral Given 11/24/20 2353)    ED Course  I have reviewed the triage vital signs and the nursing notes.  Pertinent labs & imaging results that were available during my care of the patient were reviewed  by me and considered in my medical decision making (see chart for details).    MDM Rules/Calculators/A&P                           Patient with ankle fracture.  Fibula and posterior malleolus.  Not frankly dislocated but mild displacement.  Some forming done during splinting.  X-ray done and reassuring.  Pain medicine given.  Follow-up with orthopedic surgery.  Pain medicine given.  Discharge Final Clinical Impression(s) / ED Diagnoses Final diagnoses:  Ankle injury  Closed fracture of left ankle, initial encounter    Rx / DC Orders  ED Discharge Orders          Ordered    ondansetron (ZOFRAN-ODT) 4 MG disintegrating tablet  Every 8 hours PRN,   Status:  Discontinued        11/24/20 2324    HYDROcodone-acetaminophen (NORCO/VICODIN) 5-325 MG tablet  Every 6 hours PRN        11/24/20 2324    ondansetron (ZOFRAN-ODT) 4 MG disintegrating tablet  Every 8 hours PRN        11/24/20 2331             Davonna Belling, MD 11/25/20 [phone]

## 2020-11-27 ENCOUNTER — Telehealth: Payer: Self-pay | Admitting: Orthopaedic Surgery

## 2020-11-27 ENCOUNTER — Other Ambulatory Visit: Payer: Self-pay | Admitting: Surgical

## 2020-11-27 ENCOUNTER — Other Ambulatory Visit: Payer: Self-pay | Admitting: Orthopaedic Surgery

## 2020-11-27 DIAGNOSIS — G8918 Other acute postprocedural pain: Secondary | ICD-10-CM | POA: Diagnosis not present

## 2020-11-27 DIAGNOSIS — Y999 Unspecified external cause status: Secondary | ICD-10-CM | POA: Diagnosis not present

## 2020-11-27 DIAGNOSIS — X58XXXA Exposure to other specified factors, initial encounter: Secondary | ICD-10-CM | POA: Diagnosis not present

## 2020-11-27 DIAGNOSIS — S82842A Displaced bimalleolar fracture of left lower leg, initial encounter for closed fracture: Secondary | ICD-10-CM | POA: Diagnosis not present

## 2020-11-27 MED ORDER — ONDANSETRON 8 MG PO TBDP: 8.0000 mg | ORAL_TABLET | Freq: Three times a day (TID) | ORAL | 0 refills | Status: AC | PRN

## 2020-11-27 MED ORDER — HYDROCODONE-ACETAMINOPHEN 5-325 MG PO TABS: 1.0000 | ORAL_TABLET | Freq: Four times a day (QID) | ORAL | 0 refills | Status: DC | PRN

## 2020-11-27 MED ORDER — OXYCODONE-ACETAMINOPHEN 5-325 MG PO TABS: 1.0000 | ORAL_TABLET | ORAL | 0 refills | Status: DC | PRN

## 2020-11-27 NOTE — Telephone Encounter (Signed)
Sent in rx.

## 2020-11-27 NOTE — Progress Notes (Signed)
Norco sent in for post op pain. She has had problems with percocet in past with reaction. I called pharmacy and cancelled percocet.

## 2020-11-27 NOTE — Progress Notes (Signed)
Percocet for post op pain

## 2020-11-27 NOTE — Telephone Encounter (Signed)
I called and advised that her medicine has been sent to her pharmacy

## 2020-11-27 NOTE — Telephone Encounter (Signed)
Patient called. She had surgery today. Says she can not take her pain medication without Zofran. Would like Zofran called in for her. Her call back number is [phone]

## 2020-12-01 ENCOUNTER — Telehealth: Payer: Self-pay | Admitting: Orthopaedic Surgery

## 2020-12-01 NOTE — Telephone Encounter (Signed)
Pt called requesting medical supplies from surgery. Pt is asking for orders of on leg scooter, shower chair and covers with dressing for in incision from knee surgery. Please call pt about this matter at [phone].

## 2020-12-01 NOTE — Telephone Encounter (Signed)
Can you please advise on scooter, shower chair? I will let patient know to keep dressing on and dry.

## 2020-12-03 DIAGNOSIS — Z630 Problems in relationship with spouse or partner: Secondary | ICD-10-CM | POA: Diagnosis not present

## 2020-12-03 DIAGNOSIS — F908 Attention-deficit hyperactivity disorder, other type: Secondary | ICD-10-CM | POA: Diagnosis not present

## 2020-12-03 NOTE — Progress Notes (Signed)
Office Visit Note   Patient: Claudia Rodriguez           Date of Birth: Aug 22, 1990           MRN: [id number] Visit Date: 11/25/2020              Requested by: Aloha Gell, MD [address] [place],   [id number] PCP: Aloha Gell, MD   Assessment & Plan: Visit Diagnoses:  1. Closed bimalleolar fracture of left ankle, initial encounter     Plan: X-rays reviewed with patient today and patient was also seen by Dr. Lorin Mercy.  He advised patient that best treatment option would be ORIF.  Surgery procedure discussed in great detail along with potential rehab/recovery time.  Nonweightbearing at least 6 weeks postop.  We will plan on doing surgery this week.  All questions answered.  Follow-Up Instructions: Needs return office visit 1 week postop  Orders:  No orders of the defined types were placed in this encounter.  No orders of the defined types were placed in this encounter.     Procedures: No procedures performed   Clinical Data: No additional findings.   Subjective: Chief Complaint  Patient presents with   Left Ankle - Fracture    HPI 30 year old female who is new patient to clinic comes in with left ankle fracture.  November 24, 2020 patient was playing rugby when she slipped and twisted her ankle.  She was unable to ambulate after this incident.  She was seen at the [facility] long ED and had x-rays which showed:  CLINICAL DATA:  Left ankle injury after fall.   EXAM: PORTABLE LEFT ANKLE - 2 VIEW   COMPARISON:  None.   FINDINGS: Moderately displaced oblique fracture of distal left fibula is noted. Mildly displaced posterior malleolar fracture of distal tibia is noted. Mild lateral dislocation of talus relative to distal tibia is noted.   IMPRESSION: Moderately displaced distal left fibular fracture. Mildly displaced posterior malleolar fracture of distal tibia. Mild lateral dislocation of talus relative to distal tibia.     Electronically Signed   By:  Marijo Conception M.D.   On: 11/24/2020 21:31  Patient was put in a splint and advised to follow-up in our office to discuss treatment.  Denies any other injuries.     Review of Systems Denies any current cardiac pulmonary GI GU issues  Objective: Vital Signs: Ht '5\' 7"'$  (1.702 m)   Wt 164 lb (74.4 kg)   BMI 25.69 kg/m   Physical Exam Constitutional:      Appearance: Normal appearance.  HENT:     Head: Normocephalic and atraumatic.     Nose: Nose normal.  Eyes:     Extraocular Movements: Extraocular movements intact.     Pupils: Pupils are equal, round, and reactive to light.  Cardiovascular:     Rate and Rhythm: Regular rhythm.     Heart sounds: Normal heart sounds.  Pulmonary:     Effort: Pulmonary effort is normal. No respiratory distress.     Breath sounds: Normal breath sounds.  Abdominal:     General: There is no distension.  Musculoskeletal:     Comments: Splint intact.  Moves toes well.  Neurological:     Mental Status: She is alert and oriented to person, place, and time.    Ortho Exam  Specialty Comments:  No specialty comments available.  Imaging: No results found.   PMFS History: Patient Active Problem List   Diagnosis Date Noted  Postpartum care following vaginal delivery (8/1) 10/28/2015   Pregnancy 10/27/2015   Past Medical History:  Diagnosis Date   Postpartum care following vaginal delivery (8/1) 10/28/2015    History reviewed. No pertinent family history.  Past Surgical History:  Procedure Laterality Date   INDUCED ABORTION     twice   Social History   Occupational History   Not on file  Tobacco Use   Smoking status: Never   Smokeless tobacco: Never  Substance and Sexual Activity   Alcohol use: No   Drug use: No   Sexual activity: Not on file

## 2020-12-03 NOTE — Telephone Encounter (Signed)
I called patient and advised. She states that she did not know that she was not supposed to shower and has showered multiple times. She has not gotten the dressing wet and is covering it with a trash bag. She was unaware that she did not need to do this. Since she has been showering, is it possible to get the order for the chair and a knee scooter to help her get around or does she still need to wait until when she is seen in the office next Tuesday?   Patient will also be out of pain medication around Sunday. She is going to try and stretch past the 1 q 6 hrs if possible. Could you send rx refill to pharmacy with fill date of Sunday?

## 2020-12-04 ENCOUNTER — Other Ambulatory Visit: Payer: Self-pay | Admitting: Surgical

## 2020-12-04 MED ORDER — HYDROCODONE-ACETAMINOPHEN 5-325 MG PO TABS: 1.0000 | ORAL_TABLET | Freq: Four times a day (QID) | ORAL | 0 refills | Status: AC | PRN

## 2020-12-04 NOTE — Telephone Encounter (Signed)
Could you please see below and advise on pain medication? I can hold for Dr. Lorin Mercy regarding shower chair and knee scooter if needed. Patient is status post ORIF left ankle on 11/27/2020. Thanks.

## 2020-12-04 NOTE — Telephone Encounter (Signed)
Sent in refill for Sunday.

## 2020-12-04 NOTE — Telephone Encounter (Signed)
Please see below and advise.  I called patient and advised refill sent in to be filled on Sunday.  Dr. Royden Purl you please advise on shower chair, scooter, and shower covers for splinted ankle?

## 2020-12-07 NOTE — Telephone Encounter (Signed)
noted 

## 2020-12-08 ENCOUNTER — Encounter: Payer: Self-pay | Admitting: Orthopaedic Surgery

## 2020-12-08 ENCOUNTER — Other Ambulatory Visit: Payer: Self-pay

## 2020-12-08 ENCOUNTER — Ambulatory Visit (INDEPENDENT_AMBULATORY_CARE_PROVIDER_SITE_OTHER): Payer: BC Managed Care – PPO | Admitting: Orthopaedic Surgery

## 2020-12-08 ENCOUNTER — Ambulatory Visit: Payer: Self-pay

## 2020-12-08 VITALS — BP 133/92 | HR 98 | Ht 67.0 in | Wt 164.0 lb

## 2020-12-08 DIAGNOSIS — S82842D Displaced bimalleolar fracture of left lower leg, subsequent encounter for closed fracture with routine healing: Secondary | ICD-10-CM

## 2020-12-08 DIAGNOSIS — S82842A Displaced bimalleolar fracture of left lower leg, initial encounter for closed fracture: Secondary | ICD-10-CM | POA: Diagnosis not present

## 2020-12-08 NOTE — Progress Notes (Signed)
   Post-Op Visit Note   Patient: Claudia Rodriguez           Date of Birth: [birth year]           MRN: [id number] Visit Date: 12/08/2020 PCP: Aloha Gell, MD   Assessment & Plan: 1 week post-ORIF lateral malleolus for bimalleolar ankle fracture involving posterior malleolus and lateral malleolus.  Post fixation of lateral malleolus the posterior malleolus was anatomic and did not require screw fixation.  Chief Complaint:  Chief Complaint  Patient presents with   Left Ankle - Routine Post Op    11/27/2020 ORIF left ankle   Visit Diagnoses:  1. Closed bimalleolar fracture of left ankle, initial encounter   2. Bimalleolar ankle fracture, left, closed, with routine healing, subsequent encounter     Plan: X-rays obtained, position looks good.  Soft dressing cam boot applied.  We can discuss work resumption next week when she returns for staple removal.  Follow-Up Instructions: Return in about 1 week (around 12/15/2020).   Orders:  Orders Placed This Encounter  Procedures   XR Ankle Complete Left   No orders of the defined types were placed in this encounter.   Imaging: No results found.  PMFS History: Patient Active Problem List   Diagnosis Date Noted   Bimalleolar ankle fracture, left, closed, with routine healing, subsequent encounter 12/08/2020   Postpartum care following vaginal delivery (8/1) 10/28/2015   Pregnancy 10/27/2015   Past Medical History:  Diagnosis Date   Postpartum care following vaginal delivery (8/1) 10/28/2015    No family history on file.  Past Surgical History:  Procedure Laterality Date   INDUCED ABORTION     twice   Social History   Occupational History   Not on file  Tobacco Use   Smoking status: Never   Smokeless tobacco: Never  Substance and Sexual Activity   Alcohol use: No   Drug use: No   Sexual activity: Not on file

## 2020-12-16 ENCOUNTER — Ambulatory Visit (INDEPENDENT_AMBULATORY_CARE_PROVIDER_SITE_OTHER): Payer: BC Managed Care – PPO | Admitting: Orthopaedic Surgery

## 2020-12-16 ENCOUNTER — Encounter: Payer: Self-pay | Admitting: Orthopaedic Surgery

## 2020-12-16 VITALS — BP 124/82 | HR 82 | Ht 67.0 in | Wt 164.0 lb

## 2020-12-16 DIAGNOSIS — S82842D Displaced bimalleolar fracture of left lower leg, subsequent encounter for closed fracture with routine healing: Secondary | ICD-10-CM

## 2020-12-16 NOTE — Progress Notes (Signed)
   Post-Op Visit Note   Patient: Claudia Rodriguez           Date of Birth: 1990/09/03           MRN: [id number] Visit Date: 12/16/2020 PCP: Aloha Gell, MD   Assessment & Plan: 1 follow-up ORIF by malleolus fracture with fixation lateral malleolus.  Staples are harvested today continue cam boot.  Nonweightbearing.  Return visit 4 weeks repeat x-rays on return and we will discuss progressive weightbearing at that time.  Chief Complaint:  Chief Complaint  Patient presents with   Left Ankle - Follow-up    11/27/2020 ORIF left ankle   Visit Diagnoses:  1. Bimalleolar ankle fracture, left, closed, with routine healing, subsequent encounter     Plan: ROV 4 wks xrays on return  Follow-Up Instructions: Return in about 4 weeks (around 01/13/2021).   Orders:  No orders of the defined types were placed in this encounter.  No orders of the defined types were placed in this encounter.   Imaging: No results found.  PMFS History: Patient Active Problem List   Diagnosis Date Noted   Bimalleolar ankle fracture, left, closed, with routine healing, subsequent encounter 12/08/2020   Postpartum care following vaginal delivery (8/1) 10/28/2015   Pregnancy 10/27/2015   Past Medical History:  Diagnosis Date   Postpartum care following vaginal delivery (8/1) 10/28/2015    No family history on file.  Past Surgical History:  Procedure Laterality Date   INDUCED ABORTION     twice   Social History   Occupational History   Not on file  Tobacco Use   Smoking status: Never   Smokeless tobacco: Never  Substance and Sexual Activity   Alcohol use: No   Drug use: No   Sexual activity: Not on file

## 2020-12-22 ENCOUNTER — Telehealth: Payer: Self-pay | Admitting: Orthopaedic Surgery

## 2020-12-22 NOTE — Telephone Encounter (Signed)
Note entered. I left voicemail advising note at front for pick up. Requested call back if she would like for me to send it somewhere for her.

## 2020-12-22 NOTE — Telephone Encounter (Signed)
Please advise 

## 2020-12-22 NOTE — Telephone Encounter (Signed)
Pt asking if she can get an updated doctor note to extend hr out of work until 01/04/21. Current note has her out until 12/28/20 but pt states she is very sore. The best call back number is [phone].

## 2020-12-23 ENCOUNTER — Telehealth: Payer: Self-pay | Admitting: Orthopaedic Surgery

## 2020-12-23 NOTE — Telephone Encounter (Signed)
Pt would like to send the note by email. Pt would like a call when its done!   Email : [email or web address]

## 2020-12-23 NOTE — Telephone Encounter (Signed)
Emailed to patient. I called and advised.

## 2021-01-13 ENCOUNTER — Ambulatory Visit (INDEPENDENT_AMBULATORY_CARE_PROVIDER_SITE_OTHER): Payer: BC Managed Care – PPO | Admitting: Orthopaedic Surgery

## 2021-01-13 ENCOUNTER — Other Ambulatory Visit: Payer: Self-pay

## 2021-01-13 ENCOUNTER — Ambulatory Visit: Payer: Self-pay

## 2021-01-13 VITALS — BP 128/87 | HR 113

## 2021-01-13 DIAGNOSIS — S82842D Displaced bimalleolar fracture of left lower leg, subsequent encounter for closed fracture with routine healing: Secondary | ICD-10-CM

## 2021-01-13 NOTE — Progress Notes (Signed)
   Post-Op Visit Note   Patient: Claudia Rodriguez           Date of Birth: 1991-01-04           MRN: [id number] Visit Date: 01/13/2021 PCP: Aloha Gell, MD   Assessment & Plan: Follow-up bimalleolar ankle fracture without fixation of posterior malleolar fragment.  Lateral malleolus was fixed.  Ankle was reduced by x-ray.  Chief Complaint:  Chief Complaint  Patient presents with   Right Ankle - Routine Post Op    11/27/20 ORIF left ankle   Visit Diagnoses:  1. Bimalleolar ankle fracture, left, closed, with routine healing, subsequent encounter     Plan: Postop bimalleolar ankle fracture x-rays show satisfactory position and healing she can progressively weight-bear as tolerated in the boot.  Recheck 4 weeks.  No x-ray needed on return.  Follow-Up Instructions: No follow-ups on file.   Orders:  Orders Placed This Encounter  Procedures   XR Ankle Complete Left   No orders of the defined types were placed in this encounter.   Imaging: No results found.  PMFS History: Patient Active Problem List   Diagnosis Date Noted   Bimalleolar ankle fracture, left, closed, with routine healing, subsequent encounter 12/08/2020   Postpartum care following vaginal delivery (8/1) 10/28/2015   Pregnancy 10/27/2015   Past Medical History:  Diagnosis Date   Postpartum care following vaginal delivery (8/1) 10/28/2015    No family history on file.  Past Surgical History:  Procedure Laterality Date   INDUCED ABORTION     twice   Social History   Occupational History   Not on file  Tobacco Use   Smoking status: Never   Smokeless tobacco: Never  Substance and Sexual Activity   Alcohol use: No   Drug use: No   Sexual activity: Not on file

## 2021-02-09 ENCOUNTER — Encounter: Payer: BC Managed Care – PPO | Admitting: Orthopaedic Surgery

## 2021-02-10 ENCOUNTER — Ambulatory Visit: Payer: BC Managed Care – PPO | Admitting: Orthopaedic Surgery

## 2021-09-21 DIAGNOSIS — L700 Acne vulgaris: Secondary | ICD-10-CM | POA: Diagnosis not present

## 2021-09-21 DIAGNOSIS — Z5181 Encounter for therapeutic drug level monitoring: Secondary | ICD-10-CM | POA: Diagnosis not present

## 2021-09-21 DIAGNOSIS — L739 Follicular disorder, unspecified: Secondary | ICD-10-CM | POA: Diagnosis not present

## 2021-10-22 DIAGNOSIS — L700 Acne vulgaris: Secondary | ICD-10-CM | POA: Diagnosis not present

## 2021-11-18 DIAGNOSIS — L680 Hirsutism: Secondary | ICD-10-CM | POA: Diagnosis not present

## 2021-11-18 DIAGNOSIS — R635 Abnormal weight gain: Secondary | ICD-10-CM | POA: Diagnosis not present

## 2021-11-18 DIAGNOSIS — F909 Attention-deficit hyperactivity disorder, unspecified type: Secondary | ICD-10-CM | POA: Diagnosis not present

## 2021-11-18 DIAGNOSIS — D443 Neoplasm of uncertain behavior of pituitary gland: Secondary | ICD-10-CM | POA: Diagnosis not present

## 2021-11-19 DIAGNOSIS — D443 Neoplasm of uncertain behavior of pituitary gland: Secondary | ICD-10-CM | POA: Diagnosis not present

## 2021-11-19 DIAGNOSIS — R635 Abnormal weight gain: Secondary | ICD-10-CM | POA: Diagnosis not present

## 2021-11-19 DIAGNOSIS — L680 Hirsutism: Secondary | ICD-10-CM | POA: Diagnosis not present

## 2022-01-04 DIAGNOSIS — R635 Abnormal weight gain: Secondary | ICD-10-CM | POA: Diagnosis not present

## 2022-01-04 DIAGNOSIS — D443 Neoplasm of uncertain behavior of pituitary gland: Secondary | ICD-10-CM | POA: Diagnosis not present

## 2022-01-04 DIAGNOSIS — L680 Hirsutism: Secondary | ICD-10-CM | POA: Diagnosis not present

## 2022-01-04 DIAGNOSIS — F909 Attention-deficit hyperactivity disorder, unspecified type: Secondary | ICD-10-CM | POA: Diagnosis not present
